# Patient Record
Sex: Male | Born: 1985 | Race: White | Hispanic: No | Marital: Married | Smoking: Former smoker
Health system: Southern US, Community
[De-identification: ages and names within clinical notes are randomized; demographics above are authoritative.]

## PROBLEM LIST (undated history)

## (undated) DIAGNOSIS — T7840XA Allergy, unspecified, initial encounter: Secondary | ICD-10-CM

## (undated) HISTORY — DX: Allergy, unspecified, initial encounter: T78.40XA

---

## 2009-10-21 ENCOUNTER — Emergency Department: Payer: Self-pay | Admitting: Emergency Medicine

## 2011-03-25 ENCOUNTER — Emergency Department: Payer: Self-pay

## 2013-08-02 ENCOUNTER — Emergency Department: Payer: Self-pay | Admitting: Emergency Medicine

## 2014-09-14 ENCOUNTER — Emergency Department: Payer: Self-pay

## 2014-09-14 ENCOUNTER — Emergency Department
Admission: EM | Admit: 2014-09-14 | Discharge: 2014-09-14 | Disposition: A | Discharge status: Home / Self Care | Payer: Self-pay | Attending: Emergency Medicine | Admitting: Emergency Medicine

## 2014-09-14 DIAGNOSIS — S4991XA Unspecified injury of right shoulder and upper arm, initial encounter: Secondary | ICD-10-CM | POA: Insufficient documentation

## 2014-09-14 DIAGNOSIS — L03115 Cellulitis of right lower limb: Secondary | ICD-10-CM | POA: Insufficient documentation

## 2014-09-14 DIAGNOSIS — W228XXA Striking against or struck by other objects, initial encounter: Secondary | ICD-10-CM | POA: Insufficient documentation

## 2014-09-14 DIAGNOSIS — Y9289 Other specified places as the place of occurrence of the external cause: Secondary | ICD-10-CM | POA: Insufficient documentation

## 2014-09-14 DIAGNOSIS — S80811A Abrasion, right lower leg, initial encounter: Secondary | ICD-10-CM | POA: Insufficient documentation

## 2014-09-14 DIAGNOSIS — S060X1A Concussion with loss of consciousness of 30 minutes or less, initial encounter: Secondary | ICD-10-CM | POA: Insufficient documentation

## 2014-09-14 DIAGNOSIS — Y9389 Activity, other specified: Secondary | ICD-10-CM | POA: Insufficient documentation

## 2014-09-14 DIAGNOSIS — Y998 Other external cause status: Secondary | ICD-10-CM | POA: Insufficient documentation

## 2014-09-14 DIAGNOSIS — S4992XA Unspecified injury of left shoulder and upper arm, initial encounter: Secondary | ICD-10-CM | POA: Insufficient documentation

## 2014-09-14 DIAGNOSIS — S199XXA Unspecified injury of neck, initial encounter: Secondary | ICD-10-CM | POA: Insufficient documentation

## 2014-09-14 MED ORDER — CEPHALEXIN 500 MG PO CAPS: 500.0000 mg | ORAL_CAPSULE | Freq: Four times a day (QID) | ORAL | Status: AC

## 2014-09-14 MED ORDER — SULFAMETHOXAZOLE-TRIMETHOPRIM 800-160 MG PO TABS: 1.0000 | ORAL_TABLET | Freq: Two times a day (BID) | ORAL | Status: DC

## 2014-09-14 MED ORDER — SULFAMETHOXAZOLE-TRIMETHOPRIM 800-160 MG PO TABS
1.0000 | ORAL_TABLET | Freq: Once | ORAL | Status: AC
  Administered 2014-09-14: 1 via ORAL
  Filled 2014-09-14: qty 1

## 2014-09-14 MED ORDER — CEPHALEXIN 500 MG PO CAPS
500.0000 mg | ORAL_CAPSULE | Freq: Once | ORAL | Status: AC
  Administered 2014-09-14: 500 mg via ORAL
  Filled 2014-09-14: qty 1
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
## 2014-09-14 NOTE — ED Notes (Signed)
Patient with no complaints at this time. Respirations even and unlabored. Skin warm/dry. Discharge instructions reviewed with patient at this time. Patient given opportunity to voice concerns/ask questions. IV removed per policy and band-aid applied to site. Patient discharged at this time and left Emergency Department with steady gait.  

## 2014-09-14 NOTE — Discharge Instructions (Signed)
Cellulitis Cellulitis is an infection of the skin and the tissue under the skin. The infected area is usually red and tender. This happens most often in the arms and lower legs. HOME CARE   Take your antibiotic medicine as told. Finish the medicine even if you start to feel better.  Keep the infected arm or leg raised (elevated).  Put a warm cloth on the area up to 4 times per day.  Only take medicines as told by your doctor.  Keep all doctor visits as told. GET HELP IF:  You see red streaks on the skin coming from the infected area.  Your red area gets bigger or turns a dark color.  Your bone or joint under the infected area is painful after the skin heals.  Your infection comes back in the same area or different area.  You have a puffy (swollen) bump in the infected area.  You have new symptoms.  You have a fever. GET HELP RIGHT AWAY IF:   You feel very sleepy.  You throw up (vomit) or have watery poop (diarrhea).  You feel sick and have muscle aches and pains. MAKE SURE YOU:   Understand these instructions.  Will watch your condition.  Will get help right away if you are not doing well or get worse. Document Released: 07/29/2007 Document Revised: 06/26/2013 Document Reviewed: 04/27/2011 [facility] Patient Information 2015 [place], [place]. This information is not intended to replace advice given to you by your health care provider. Make sure you discuss any questions you have with your health care provider.  Concussion A concussion is a brain injury. It is caused by:  A hit to the head.  A quick and sudden movement (jolt) of the head or neck. A concussion is usually not life threatening. Even so, it can cause serious problems. If you had a concussion before, you may have concussion-like problems after a hit to your head. HOME CARE General Instructions  Follow your doctor's directions carefully.  Take medicines only as told by your doctor.  Only take  medicines your doctor says are safe.  Do not drink alcohol until your doctor says it is okay. Alcohol and some drugs can slow down healing. They can also put you at risk for further injury.  If you are having trouble remembering things, write them down.  Try to do one thing at a time if you get distracted easily. For example, do not watch TV while making dinner.  Talk to your family members or close friends when making important decisions.  Follow up with your doctor as told.  Watch your symptoms. Tell others to do the same. Serious problems can sometimes happen after a concussion. Older adults are more likely to have these problems.  Tell your teachers, school nurse, school counselor, coach, Event organiser, or work Production designer, theatre/television/film about your concussion. Tell them about what you can or cannot do. They should watch to see if:  It gets even harder for you to pay attention or concentrate.  It gets even harder for you to remember things or learn new things.  You need more time than normal to finish things.  You become annoyed (irritable) more than before.  You are not able to deal with stress as well.  You have more problems than before.  Rest. Make sure you:  Get plenty of sleep at night.  Go to sleep early.  Go to bed at the same time every day. Try to wake up at the same time.  Rest during the day.  Take naps when you feel tired.  Limit activities where you have to think a lot or concentrate. These include:  Doing homework.  Doing work related to a job.  Watching TV.  Using the computer. Returning To Your Regular Activities Return to your normal activities slowly, not all at once. You must give your body and brain enough time to heal.   Do not play sports or do other athletic activities until your doctor says it is okay.  Ask your doctor when you can drive, ride a bicycle, or work other vehicles or machines. Never do these things if you feel dizzy.  Ask your doctor  about when you can return to work or school. Preventing Another Concussion It is very important to avoid another brain injury, especially before you have healed. In rare cases, another injury can lead to permanent brain damage, brain swelling, or death. The risk of this is greatest during the first 7-10 days after your injury. Avoid injuries by:   Wearing a seat belt when riding in a car.  Not drinking too much alcohol.  Avoiding activities that could lead to a second concussion (such as contact sports).  Wearing a helmet when doing activities like:  Biking.  Skiing.  Skateboarding.  Skating.  Making your home safer by:  Removing things from the floor or stairways that could make you trip.  Using grab bars in bathrooms and handrails by stairs.  Placing non-slip mats on floors and in bathtubs.  Improve lighting in dark areas. GET HELP IF:  It gets even harder for you to pay attention or concentrate.  It gets even harder for you to remember things or learn new things.  You need more time than normal to finish things.  You become annoyed (irritable) more than before.  You are not able to deal with stress as well.  You have more problems than before.  You have problems keeping your balance.  You are not able to react quickly when you should. Get help if you have any of these problems for more than 2 weeks:   Lasting (chronic) headaches.  Dizziness or trouble balancing.  Feeling sick to your stomach (nausea).  Seeing (vision) problems.  Being affected by noises or light more than normal.  Feeling sad, low, down in the dumps, blue, gloomy, or empty (depressed).  Mood changes (mood swings).  Feeling of fear or nervousness about what may happen (anxiety).  Feeling annoyed.  Memory problems.  Problems concentrating or paying attention.  Sleep problems.  Feeling tired all the time. GET HELP RIGHT AWAY IF:   You have bad headaches or your headaches get  worse.  You have weakness (even if it is in one hand, leg, or part of the face).  You have loss of feeling (numbness).  You feel off balance.  You keep throwing up (vomiting).  You feel tired.  One black center of your eye (pupil) is larger than the other.  You twitch or shake violently (convulse).  Your speech is not clear (slurred).  You are more confused, easily angered (agitated), or annoyed than before.  You have more trouble resting than before.  You are unable to recognize people or places.  You have neck pain.  It is difficult to wake you up.  You have unusual behavior changes.  You pass out (lose consciousness). MAKE SURE YOU:   Understand these instructions.  Will watch your condition.  Will get help right away if  you are not doing well or get worse. Document Released: 01/28/2009 Document Revised: 06/26/2013 Document Reviewed: 09/01/2012 [facility] Patient Information 2015 [place], [place]. This information is not intended to replace advice given to you by your health care provider. Make sure you discuss any questions you have with your health care provider.

## 2015-01-09 ENCOUNTER — Emergency Department: Payer: Self-pay

## 2015-01-09 ENCOUNTER — Emergency Department
Admission: EM | Admit: 2015-01-09 | Discharge: 2015-01-09 | Disposition: A | Discharge status: Home / Self Care | Payer: Self-pay | Attending: Emergency Medicine | Admitting: Emergency Medicine

## 2015-01-09 DIAGNOSIS — R519 Headache, unspecified: Secondary | ICD-10-CM

## 2015-01-09 DIAGNOSIS — K0889 Other specified disorders of teeth and supporting structures: Secondary | ICD-10-CM | POA: Insufficient documentation

## 2015-01-09 DIAGNOSIS — F17210 Nicotine dependence, cigarettes, uncomplicated: Secondary | ICD-10-CM | POA: Insufficient documentation

## 2015-01-09 DIAGNOSIS — Z79899 Other long term (current) drug therapy: Secondary | ICD-10-CM | POA: Insufficient documentation

## 2015-01-09 DIAGNOSIS — R51 Headache: Secondary | ICD-10-CM | POA: Insufficient documentation

## 2015-01-09 LAB — CBC
HCT: 41.7 % (ref 40.0–52.0)
Hemoglobin: 14.3 g/dL (ref 13.0–18.0)
MCH: 29.3 pg (ref 26.0–34.0)
MCHC: 34.3 g/dL (ref 32.0–36.0)
MCV: 85.2 fL (ref 80.0–100.0)
Platelets: 155 K/uL (ref 150–440)
RBC: 4.89 MIL/uL (ref 4.40–5.90)
RDW: 13.1 % (ref 11.5–14.5)
WBC: 8.6 K/uL (ref 3.8–10.6)

## 2015-01-09 LAB — COMPREHENSIVE METABOLIC PANEL WITH GFR
ALT: 47 U/L (ref 17–63)
AST: 29 U/L (ref 15–41)
Albumin: 4.4 g/dL (ref 3.5–5.0)
Alkaline Phosphatase: 70 U/L (ref 38–126)
Anion gap: 6 (ref 5–15)
BUN: 12 mg/dL (ref 6–20)
CO2: 26 mmol/L (ref 22–32)
Calcium: 9.2 mg/dL (ref 8.9–10.3)
Chloride: 106 mmol/L (ref 101–111)
Creatinine, Ser: 0.76 mg/dL (ref 0.61–1.24)
GFR calc Af Amer: >60 mL/min
GFR calc non Af Amer: >60 mL/min
Glucose, Bld: 106 mg/dL — ABNORMAL HIGH (ref 65–99)
Potassium: 4.4 mmol/L (ref 3.5–5.1)
Sodium: 138 mmol/L (ref 135–145)
Total Bilirubin: 1 mg/dL (ref 0.3–1.2)
Total Protein: 7.4 g/dL (ref 6.5–8.1)

## 2015-01-09 MED ORDER — HYDROCODONE-ACETAMINOPHEN 5-325 MG PO TABS: 1.0000 | ORAL_TABLET | ORAL | Status: DC | PRN

## 2015-01-09 MED ORDER — KETOROLAC TROMETHAMINE 30 MG/ML IJ SOLN
30.0000 mg | Freq: Once | INTRAMUSCULAR | Status: AC
  Administered 2015-01-09: 30 mg via INTRAVENOUS
  Filled 2015-01-09: qty 1

## 2015-01-09 MED ORDER — PENICILLIN V POTASSIUM 500 MG PO TABS: 500.0000 mg | ORAL_TABLET | Freq: Four times a day (QID) | ORAL | Status: DC

## 2015-01-09 MED ORDER — DIPHENHYDRAMINE HCL 50 MG/ML IJ SOLN
50.0000 mg | Freq: Once | INTRAMUSCULAR | Status: AC
  Administered 2015-01-09: 50 mg via INTRAVENOUS
  Filled 2015-01-09: qty 1

## 2015-01-09 MED ORDER — METOCLOPRAMIDE HCL 5 MG/ML IJ SOLN
10.0000 mg | Freq: Once | INTRAMUSCULAR | Status: AC
  Administered 2015-01-09: 10 mg via INTRAVENOUS
  Filled 2015-01-09: qty 2

## 2015-01-09 NOTE — Discharge Instructions (Signed)
OPTIONS FOR DENTAL FOLLOW UP CARE ° °[place] Department of Health and Human Services - Local Safety Net Dental Clinics °http://www.ncdhhs.gov/dph/oralhealth/services/safetynetclinics.htm °  °[facility] (336-562-3123) ° °[facility] [place] ([phone]) ° °[facility] ([phone] ext [phone]) ° °[facility] ([phone]) ° °[facility] ([phone]) °This clinic caters to the indigent population and is on a lottery system. °Location: °[facility], [address], [place] °Clinic Hours: °Wednesdays from 6pm - 9pm, patients seen by a lottery system. °For dates, call or go to [email or web address].[facility].edu/[facility]/patients/Dental-[facility] °Services: °Cleanings, fillings and simple extractions. °Payment Options: °DENTAL WORK IS FREE OF CHARGE. Bring proof of income or support. °Best way to get seen: °Arrive at 5:15 pm - this is a lottery, NOT first come/first serve, so arriving earlier will not increase your chances of being seen. °  °  °[facility] °[phone] °Select option 1 for emergencies °  °Location: °[facility], [address], [place] °Clinic Hours: °No walk-ins accepted - call the day before to schedule an appointment. °Check in times are 9:30 am and 1:30 pm. °Services: °Simple extractions, temporary fillings, pulpectomy/pulp debridement, uncomplicated abscess drainage. °Payment Options: °PAYMENT IS DUE AT THE TIME OF SERVICE.  Fee is usually $100-200, additional surgical procedures (e.g. abscess drainage) may be extra. °Cash, checks, Visa/MasterCard accepted.  Can file Medicaid if patient is covered for dental - patient should call case worker to check. °No discount for [facility] patients. °Best way to get seen: °MUST call the day before and get onto the schedule. Can usually be seen the next 1-2 days. No walk-ins accepted. °  °  °[facility] °[phone] °   °Location: °[facility], [address], [place] °Clinic Hours: °M, W, Th, F 8am or 1:30pm, Tues 9a or 1:30 - first come/first served. °Services: °Simple extractions, temporary fillings, uncomplicated abscess drainage.  You do not need to be an [place] resident. °Payment Options: °PAYMENT IS DUE AT THE TIME OF SERVICE. °Dental insurance, otherwise sliding scale - bring proof of income or support. °Depending on income and treatment needed, cost is usually $50-200. °Best way to get seen: °Arrive early as it is first come/first served. °  °  °[facility] °[phone] °  °Location: °[address] °Clinic Hours: °Mon-Thu 8a-5p °Services: °Most basic dental services including extractions and fillings. °Payment Options: °PAYMENT IS DUE AT THE TIME OF SERVICE. °Sliding scale, up to 50% off - bring proof if income or support. °Medicaid with dental option accepted. °Best way to get seen: °Call to schedule an appointment, can usually be seen within 2 weeks OR they will try to see walk-ins - show up at 8a or 2p (you may have to wait). °  °  °[facility] °[phone] °[place] RESIDENTS ONLY °  °Location: °[facility], [address], [place],  [address] °Clinic Hours: By appointment only. °Monday - Thursday 8am-5pm, Friday 8am-12pm °Services: Cleanings, fillings, extractions. °Payment Options: °PAYMENT IS DUE AT THE TIME OF SERVICE. °Cash, Visa or MasterCard. Sliding scale - $30 minimum per service. °Best way to get seen: °Come in to office, complete packet and make an appointment - need proof of income °or support monies for each household member and proof of [place] residence. °Usually takes about a month to get in. °  °  °[facility] °[phone] °  °Location: °[address]   [place] [facility] Hours: Walk-in Urgent Care Dental Services are offered Monday-Friday  mornings only. The numbers of emergencies accepted daily is limited to the number of providers available. Maximum 15 - Mondays, Wednesdays & Thursdays Maximum 10 - Tuesdays & Fridays Services: You do not need to be a [facility] resident to be seen for a dental emergency. Emergencies are defined as pain, swelling, abnormal bleeding, or dental trauma. Walkins will receive x-rays if needed. NOTE: Dental cleaning is not an emergency. Payment Options: PAYMENT IS DUE AT THE TIME OF SERVICE. Minimum co-pay is $40.00 for uninsured patients. Minimum co-pay is $3.00 for Medicaid with dental coverage. Dental Insurance is accepted and must be presented at time of visit. Medicare does not cover dental. Forms of payment: Cash, credit card, checks. Best way to get seen: If not previously registered with the clinic, walk-in dental registration begins at 7:15 am and is on a first come/first serve basis. If previously registered with the clinic, call to make an appointment.     [facility] [phone] [place] RESIDENTS ONLY   Location: [address], [place], [place] Clinic Hours: Mon-Thu 10a-2p Services: Extractions only! Payment Options: FREE (donations accepted) - bring proof of income or support Best way to get seen: Call and schedule an appointment OR come at 8am on the 1st Monday of every month (except for holidays) when it is first come/first served.     [facility] [phone]   Location: [address] [place], [place] Clinic Hours: Friday mornings Services, Payment Options, Best way to get seen: Call for info    General Headache Without Cause A headache is pain or discomfort felt around the head or neck area. There are many causes and types of headaches. In some cases, the cause may not be found.  HOME CARE  Managing Pain  Take over-the-counter and prescription medicines only as told by your doctor.  Lie down in a dark, quiet room when you have a  headache.  If directed, apply ice to the head and neck area:  Put ice in a plastic bag.  Place a towel between your skin and the bag.  Leave the ice on for 20 minutes, 2-3 times per day.  Use a heating pad or hot shower to apply heat to the head and neck area as told by your doctor.  Keep lights dim if bright lights bother you or make your headaches worse. Eating and Drinking  Eat meals on a regular schedule.  Lessen how much alcohol you drink.  Lessen how much caffeine you drink, or stop drinking caffeine. General Instructions  Keep all follow-up visits as told by your doctor. This is important.  Keep a journal to find out if certain things bring on headaches. For example, write down:  What you eat and drink.  How much sleep you get.  Any change to your diet or medicines.  Relax by getting a massage or doing other relaxing activities.  Lessen stress.  Sit up straight. Do not tighten (tense) your muscles.  Do not use tobacco products. This includes cigarettes, chewing tobacco, or e-cigarettes. If you need help quitting, ask your doctor.  Exercise regularly as told by your doctor.  Get enough sleep. This often means 7-9 hours of sleep. GET HELP IF:  Your symptoms are not helped by medicine.  You have a headache that feels different than the other headaches.  You feel sick to your stomach (nauseous) or you throw up (vomit).  You have  a fever. GET HELP RIGHT AWAY IF:   Your headache becomes really bad.  You keep throwing up.  You have a stiff neck.  You have trouble seeing.  You have trouble speaking.  You have pain in the eye or ear.  Your muscles are weak or you lose muscle control.  You lose your balance or have trouble walking.  You feel like you will pass out (faint) or you pass out.  You have confusion.   This information is not intended to replace advice given to you by your health care provider. Make sure you discuss any questions you  have with your health care provider.   Document Released: 11/19/2007 Document Revised: 10/31/2014 Document Reviewed: 06/04/2014 Elsevier Interactive Patient Education [employer]2016 [employer]
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
## 2015-01-09 NOTE — ED Provider Notes (Signed)
[facility] Emergency Department Provider Note  Time seen: 10:53 AM  I have reviewed the triage vital signs and the nursing notes.   HISTORY  Chief Complaint Headache    HPI Jesse Kane is a 29 y.o. male with no past medical history who presents the emergency department with a headache. According to the patient he has had an intermittent headache for the past 3 weeks. He describes it as coming and going, usually resolves with ibuprofen. He states for the past 3-4 days it has been constant. States it hurts to look Bright light, hurts to hear loud noises. Denies any fever, nausea, vomiting, neck pain or stiffness. Denies any history of migraines. Does state he has been having some tooth pain recently as well due to cavities, which has worsened over the past several weeks.Describes his headache as mostly left-sided, aching pain starts in the forehead and comes down the left side of his face.     History reviewed. No pertinent past medical history.  There are no active problems to display for this patient.   History reviewed. No pertinent past surgical history.  Current Outpatient Rx  Name  Route  Sig  Dispense  Refill  . sulfamethoxazole-trimethoprim (BACTRIM DS,SEPTRA DS) 800-160 MG per tablet   Oral   Take 1 tablet by mouth 2 (two) times daily.   20 tablet   0     Allergies Review of patient's allergies indicates no known allergies.  No family history on file.  Social History Social History  Substance Use Topics  . Smoking status: Current Every Day Smoker    Types: Cigarettes  . Smokeless tobacco: Never Used  . Alcohol Use: Yes     Comment: rarely    Review of Systems Constitutional: Negative for fever. Cardiovascular: Negative for chest pain. Respiratory: Negative for shortness of breath. Gastrointestinal: Negative for abdominal pain Musculoskeletal: Negative for back pain. Negative for neck pain. Skin: Negative for rash. Neurological:  Positive for headache. Denies any weakness or numbness, slurred speech or confusion. 10-point ROS otherwise negative.  ____________________________________________   PHYSICAL EXAM:  VITAL SIGNS: ED Triage Vitals  Enc Vitals Group     BP 01/09/15 0913 144/67 mmHg     Pulse Rate 01/09/15 0913 73     Resp 01/09/15 0913 16     Temp 01/09/15 0913 97.5 F (36.4 C)     Temp Source 01/09/15 0913 Oral     SpO2 01/09/15 0913 97 %     Weight 01/09/15 0913 200 lb (90.719 kg)     Height 01/09/15 0913  (1.88 m)     Head Cir --      Peak Flow --      Pain Score 01/09/15 0914 8     Pain Loc --      Pain Edu? --      Excl. in GC? --    Constitutional: Alert and oriented. Well appearing and in no distress. Eyes: Normal exam ENT   Head: Normocephalic and atraumatic. Mild photophobia. PERRL, 2 mm. EOMI.   Mouth/Throat: Mucous membranes are moist. Many dental caries, with tooth decay. Left upper molar is tender, no signs of abscess. Cardiovascular: Normal rate, regular rhythm. No murmur Respiratory: Normal respiratory effort without tachypnea nor retractions. Breath sounds are clear  Gastrointestinal: Soft and nontender. No distention.   Musculoskeletal: Nontender with normal range of motion in all extremities.  Neurologic:  Normal speech and language. No gross focal neurologic deficits Skin:  Skin is  warm, dry and intact.  Psychiatric: Mood and affect are normal. Speech and behavior are normal.   ____________________________________________     RADIOLOGY  CT shows no acute abnormality  ____________________________________________    INITIAL IMPRESSION / ASSESSMENT AND PLAN / ED COURSE  Pertinent labs & imaging results that were available during my care of the patient were reviewed by me and considered in my medical decision making (see chart for details).  Patient presents with 3 weeks of intermittent headache. He also states for the past several weeks he has had  increased tooth pain especially on the left. Patient's headache is also on the left. Denies any fever, neck pain or stiffness. No meningismus on exam. Minimal photophobia on exam. No signs of tooth abscess, but the patient does have poor dental care overall with several decayed teeth on the left side. Given his left-sided headache, with left tooth pain for similar time durations I believe the 2 are linked. We'll check a head CT as the patient does not have a history of severe headaches. We'll check basic labs and dose IV medications. The patient's headache symptoms improved we will likely discharge on penicillin for his teeth.  CT negative. We'll discharge home with penicillin and a short course of Norco. The patient is to follow up with a dentist which is likely the cause of his headache. I discussed my normal headache return precautions with the patient, they are agreeable.  ____________________________________________   FINAL CLINICAL IMPRESSION(S) / ED DIAGNOSES  Headache Tooth pain   Minna AntisKevin Riggins Cisek, MD 01/09/15 1318

## 2015-01-09 NOTE — ED Notes (Signed)
Pt c/o HA for the past 24hrs with nausea and photophobia. Denies vomiting or hx of migraines.. States he has taken IBU with no relief.

## 2015-12-31 ENCOUNTER — Encounter: Payer: Self-pay | Admitting: Emergency Medicine

## 2015-12-31 ENCOUNTER — Emergency Department: Payer: Self-pay

## 2015-12-31 ENCOUNTER — Emergency Department
Admission: EM | Admit: 2015-12-31 | Discharge: 2015-12-31 | Disposition: A | Discharge status: Home / Self Care | Payer: Self-pay | Attending: Student in an Organized Health Care Education/Training Program | Admitting: Student in an Organized Health Care Education/Training Program

## 2015-12-31 DIAGNOSIS — F17210 Nicotine dependence, cigarettes, uncomplicated: Secondary | ICD-10-CM | POA: Insufficient documentation

## 2015-12-31 DIAGNOSIS — J069 Acute upper respiratory infection, unspecified: Secondary | ICD-10-CM | POA: Insufficient documentation

## 2015-12-31 LAB — URINALYSIS COMPLETE WITH MICROSCOPIC (ARMC ONLY)
BACTERIA UA: NONE SEEN
BILIRUBIN URINE: NEGATIVE
Glucose, UA: NEGATIVE mg/dL
KETONES UR: NEGATIVE mg/dL
Leukocytes, UA: NEGATIVE
Nitrite: NEGATIVE
PH: 7 (ref 5.0–8.0)
Protein, ur: NEGATIVE mg/dL
Specific Gravity, Urine: 1.001 — ABNORMAL LOW (ref 1.005–1.030)
Squamous Epithelial / LPF: NONE SEEN

## 2015-12-31 LAB — COMPREHENSIVE METABOLIC PANEL
ALK PHOS: 69 U/L (ref 38–126)
ALT: 24 U/L (ref 17–63)
AST: 25 U/L (ref 15–41)
Albumin: 4.7 g/dL (ref 3.5–5.0)
Anion gap: 9 (ref 5–15)
BUN: 7 mg/dL (ref 6–20)
CO2: 26 mmol/L (ref 22–32)
Calcium: 9.2 mg/dL (ref 8.9–10.3)
Chloride: 96 mmol/L — ABNORMAL LOW (ref 101–111)
Creatinine, Ser: 0.66 mg/dL (ref 0.61–1.24)
GFR calc Af Amer: >60 mL/min (ref >60)
GFR calc non Af Amer: >60 mL/min (ref >60)
Glucose, Bld: 99 mg/dL (ref 65–99)
Potassium: 3.9 mmol/L (ref 3.5–5.1)
SODIUM: 131 mmol/L — AB (ref 135–145)
TOTAL PROTEIN: 7.6 g/dL (ref 6.5–8.1)
Total Bilirubin: 1.3 mg/dL — ABNORMAL HIGH (ref 0.3–1.2)

## 2015-12-31 LAB — INFLUENZA PANEL BY PCR (TYPE A & B)
INFLBPCR: NEGATIVE
Influenza A By PCR: NEGATIVE

## 2015-12-31 LAB — CBC
HCT: 42.4 % (ref 40.0–52.0)
Hemoglobin: 14.9 g/dL (ref 13.0–18.0)
MCH: 29.4 pg (ref 26.0–34.0)
MCHC: 35.1 g/dL (ref 32.0–36.0)
MCV: 83.8 fL (ref 80.0–100.0)
Platelets: 176 10*3/uL (ref 150–440)
RBC: 5.07 MIL/uL (ref 4.40–5.90)
RDW: 12.9 % (ref 11.5–14.5)
WBC: 13.2 10*3/uL — AB (ref 3.8–10.6)

## 2015-12-31 LAB — LIPASE, BLOOD: Lipase: 18 U/L (ref 11–51)

## 2015-12-31 MED ORDER — PROCHLORPERAZINE EDISYLATE 5 MG/ML IJ SOLN
10.0000 mg | Freq: Once | INTRAMUSCULAR | Status: AC
  Administered 2015-12-31: 10 mg via INTRAVENOUS
  Filled 2015-12-31 (×2): qty 2

## 2015-12-31 MED ORDER — ACETAMINOPHEN 500 MG PO TABS
1000.0000 mg | ORAL_TABLET | Freq: Once | ORAL | Status: AC
  Administered 2015-12-31: 1000 mg via ORAL
  Filled 2015-12-31: qty 2

## 2015-12-31 MED ORDER — AZITHROMYCIN 250 MG PO TABS: ORAL_TABLET | ORAL | 0 refills | Status: AC

## 2015-12-31 MED ORDER — PROMETHAZINE HCL 12.5 MG PO TABS: 12.5000 mg | ORAL_TABLET | Freq: Four times a day (QID) | ORAL | 0 refills | Status: DC | PRN

## 2015-12-31 MED ORDER — SODIUM CHLORIDE 0.9 % IV BOLUS (SEPSIS)
1000.0000 mL | Freq: Once | INTRAVENOUS | Status: AC
  Administered 2015-12-31: 1000 mL via INTRAVENOUS

## 2015-12-31 MED ORDER — DIPHENHYDRAMINE HCL 50 MG/ML IJ SOLN
25.0000 mg | Freq: Once | INTRAMUSCULAR | Status: AC
  Administered 2015-12-31: 25 mg via INTRAVENOUS
  Filled 2015-12-31: qty 1

## 2015-12-31 NOTE — ED Provider Notes (Signed)
[facility] Emergency Department Provider Note    First MD Initiated Contact with Patient 12/31/15 1502     (approximate)  I have reviewed the triage vital signs and the nursing notes.   HISTORY  Chief Complaint Headache; Cough; and Emesis    HPI Jesse Kane is a 30 y.o. male  recently healthy male presents with 3 days of gradually worsening sore throat, nausea and headache. Patient states that his symptoms initially started with generalized malaise as well as cold chills on Saturday. States that she's also been having scantly productive cough. No measured fevers. He's been taking ibuprofen for the headache which initially helped but the headache has gradually worsened over the past 3 days. States the headache is located located and his forehead. No neck stiffness or neck pain. No difficulty with his vision. Denies any rashes.  He's been trying to drink water to stay hydrated but his had roughly 8 episodes of emesis today. Denies any blood or bilious vomiting.  History reviewed. No pertinent past medical history.  There are no active problems to display for this patient.   History reviewed. No pertinent surgical history.  Prior to Admission medications   Medication Sig Start Date End Date Taking? Authorizing Provider  azithromycin (ZITHROMAX Z-PAK) 250 MG tablet Take 2 tablets (500 mg) on  Day 1,  followed by 1 tablet (250 mg) once daily on Days 2 through 5. 12/31/15 01/05/16  Willy Eddy, MD  HYDROcodone-acetaminophen (NORCO/VICODIN) 5-325 MG tablet Take 1 tablet by mouth every 4 (four) hours as needed for moderate pain. 01/09/15   Minna Antis, MD  penicillin v potassium (VEETID) 500 MG tablet Take 1 tablet (500 mg total) by mouth 4 (four) times daily. 01/09/15   Minna Antis, MD  promethazine (PHENERGAN) 12.5 MG tablet Take 1 tablet (12.5 mg total) by mouth every 6 (six) hours as needed for nausea or vomiting. 12/31/15   Willy Eddy, MD    sulfamethoxazole-trimethoprim (BACTRIM DS,SEPTRA DS) 800-160 MG per tablet Take 1 tablet by mouth 2 (two) times daily. 09/14/14   Myrna Blazer, MD    Allergies Patient has no known allergies.  No family history on file.  Social History Social History  Substance Use Topics  . Smoking status: Current Every Day Smoker    Types: Cigarettes  . Smokeless tobacco: Never Used  . Alcohol use Yes     Comment: rarely    Review of Systems Patient denies headaches, rhinorrhea, blurry vision, numbness, shortness of breath, chest pain, edema, cough, abdominal pain, nausea, vomiting, diarrhea, dysuria, fevers, rashes or hallucinations unless otherwise stated above in HPI. ____________________________________________   PHYSICAL EXAM:  VITAL SIGNS: Vitals:   12/31/15 1347  BP: (!) 142/93  Pulse: 81  Resp: 16  Temp: 97.9 F (36.6 C)    Constitutional: Alert and oriented. Ill appearing but in NAD Eyes: Conjunctivae are normal. PERRL. EOMI. Head: Atraumatic. Nose: No congestion/rhinnorhea. Mouth/Throat: Mucous membranes are moist.  Oropharynx non-erythematous.  No exudates, tonsills are swollen but equal Neck: No stridor. Painless ROM. No cervical spine tenderness to palpation Hematological/Lymphatic/Immunilogical: No cervical lymphadenopathy. Cardiovascular: Normal rate, regular rhythm. Grossly normal heart sounds.  Good peripheral circulation. Respiratory: Normal respiratory effort.  No retractions. Lungs CTAB. Gastrointestinal: Soft and nontender. No distention. No abdominal bruits. No CVA tenderness. Musculoskeletal: No lower extremity tenderness nor edema.  No joint effusions. Neurologic:  Normal speech and language. No gross focal neurologic deficits are appreciated. No gait instability. Skin:  Skin is warm, dry  and intact. No rash noted. Psychiatric: Mood and affect are normal. Speech and behavior are normal.  ____________________________________________   LABS (all  labs ordered are listed, but only abnormal results are displayed)  Results for orders placed or performed during the hospital encounter of 12/31/15 (from the past 24 hour(s))  Lipase, blood     Status: None   Collection Time: 12/31/15  1:52 PM  Result Value Ref Range   Lipase 18 11 - 51 U/L  Comprehensive metabolic panel     Status: Abnormal   Collection Time: 12/31/15  1:52 PM  Result Value Ref Range   Sodium 131 (L) 135 - 145 mmol/L   Potassium 3.9 3.5 - 5.1 mmol/L   Chloride 96 (L) 101 - 111 mmol/L   CO2 26 22 - 32 mmol/L   Glucose, Bld 99 65 - 99 mg/dL   BUN 7 6 - 20 mg/dL   Creatinine, Ser 9.140.66 0.61 - 1.24 mg/dL   Calcium 9.2 8.9 - 78.210.3 mg/dL   Total Protein 7.6 6.5 - 8.1 g/dL   Albumin 4.7 3.5 - 5.0 g/dL   AST 25 15 - 41 U/L   ALT 24 17 - 63 U/L   Alkaline Phosphatase 69 38 - 126 U/L   Total Bilirubin 1.3 (H) 0.3 - 1.2 mg/dL   GFR calc non Af Amer >60 >60 mL/min   GFR calc Af Amer >60 >60 mL/min   Anion gap 9 5 - 15  CBC     Status: Abnormal   Collection Time: 12/31/15  1:52 PM  Result Value Ref Range   WBC 13.2 (H) 3.8 - 10.6 K/uL   RBC 5.07 4.40 - 5.90 MIL/uL   Hemoglobin 14.9 13.0 - 18.0 g/dL   HCT 95.642.4 21.340.0 - 08.652.0 %   MCV 83.8 80.0 - 100.0 fL   MCH 29.4 26.0 - 34.0 pg   MCHC 35.1 32.0 - 36.0 g/dL   RDW 57.812.9 46.911.5 - 62.914.5 %   Platelets 176 150 - 440 K/uL  Urinalysis complete, with microscopic     Status: Abnormal   Collection Time: 12/31/15  1:52 PM  Result Value Ref Range   Color, Urine COLORLESS (A) YELLOW   APPearance CLEAR (A) CLEAR   Glucose, UA NEGATIVE NEGATIVE mg/dL   Bilirubin Urine NEGATIVE NEGATIVE   Ketones, ur NEGATIVE NEGATIVE mg/dL   Specific Gravity, Urine 1.001 (L) 1.005 - 1.030   Hgb urine dipstick 2+ (A) NEGATIVE   pH 7.0 5.0 - 8.0   Protein, ur NEGATIVE NEGATIVE mg/dL   Nitrite NEGATIVE NEGATIVE   Leukocytes, UA NEGATIVE NEGATIVE   RBC / HPF 0-5 0 - 5 RBC/hpf   WBC, UA 0-5 0 - 5 WBC/hpf   Bacteria, UA NONE SEEN NONE SEEN   Squamous  Epithelial / LPF NONE SEEN NONE SEEN  Influenza panel by PCR (type A & B, H1N1)     Status: None   Collection Time: 12/31/15  4:46 PM  Result Value Ref Range   Influenza A By PCR NEGATIVE NEGATIVE   Influenza B By PCR NEGATIVE NEGATIVE   ____________________________________________ ____________________________________________  RADIOLOGY  I personally reviewed all radiographic images ordered to evaluate for the above acute complaints and reviewed radiology reports and findings.  These findings were personally discussed with the patient.  Please see medical record for radiology report.  ____________________________________________   PROCEDURES  Procedure(s) performed: none    Critical Care performed: no ____________________________________________   INITIAL IMPRESSION / ASSESSMENT AND PLAN / ED COURSE  Pertinent labs & imaging results that were available during my care of the patient were reviewed by me and considered in my medical decision making (see chart for details).  DDX: flu, strep throat, pna, sepsis, uri, sinusitits, abscess, ludwigs  Jesse Kane is a 30 y.o. who presents to the ED with complaint of several days of flulike symptoms.  Patient is AFVSS in ED. Exam as above. Given current presentation have considered the above differential.  Chest x-ray ordered to evaluate for any pneumonia. No evidence of pneumonia. Patient is having several episodes of nonbilious nonbloody vomiting. We'll give IV Phenergan. Patient describes steadily worsening headache or the past several days. Clinically not consistent with meningitis. Patient does not have any rash no neck stiffness is afebrile and does not have any neuro deficits. Possible sinusitis or tension headache. Will order laboratory evaluation to evaluate for any significant like to let abnormalities given his vomiting. We'll give IV fluids for his dehydration. Will treat headache.   Clinical Course     ----------------------------------------- 5:57 PM on 12/31/2015 -----------------------------------------  Patient reassessed and feels much improved after migraine cocktail. Flu test negative. Based on his duration of symptoms nausea and vomiting will discharge home with antiemetics as well as Z-Pak for probable sinusitis. Discussed nasal spray usage for symptomatically controlled. Discussed signs and symptoms for which the patient should return to the Er. Patient was able to ambulate with a steady gait. Has no other symptoms or complaints at this time.  Have discussed with the patient and available family all diagnostics and treatments performed thus far and all questions were answered to the best of my ability. The patient demonstrates understanding and agreement with plan.   ____________________________________________   FINAL CLINICAL IMPRESSION(S) / ED DIAGNOSES  Final diagnoses:  Upper respiratory tract infection, unspecified type      NEW MEDICATIONS STARTED DURING THIS VISIT:  New Prescriptions   AZITHROMYCIN (ZITHROMAX Z-PAK) 250 MG TABLET    Take 2 tablets (500 mg) on  Day 1,  followed by 1 tablet (250 mg) once daily on Days 2 through 5.   PROMETHAZINE (PHENERGAN) 12.5 MG TABLET    Take 1 tablet (12.5 mg total) by mouth every 6 (six) hours as needed for nausea or vomiting.     Note:  This document was prepared using Dragon voice recognition software and may include unintentional dictation errors.    Willy EddyPatrick Shamiracle Gorden, MD 12/31/15 1758

## 2015-12-31 NOTE — ED Notes (Signed)
Pt presents with headache since the weekend that he has treated with ibuprofen, with no relief. Pt states he began to have vomiting last night. Pt has vomited 8 x since it started, most recently in the waiting room. Pt also c/o pain and swelling in left jaw. States he has "a real bad tooth." Pt denies diarrhea, but states his stools have been "soft."

## 2015-12-31 NOTE — ED Notes (Signed)
Pt discharged home after verbalizing understanding of discharge instructions; nad noted. 

## 2015-12-31 NOTE — ED Notes (Signed)
AAOx3.  Skin warm and dry. Patient drinking water in triage. No N/V noted.

## 2015-12-31 NOTE — ED Triage Notes (Addendum)
C/O headache intermittently since Saturday.  Last night also c/o cold chills, vomiting and productive cough.  [facility] and referred to ED for evaluation due to year old history of concussion.

## 2016-05-31 ENCOUNTER — Emergency Department
Admission: EM | Admit: 2016-05-31 | Discharge: 2016-06-01 | Disposition: A | Discharge status: Other Acute Inpatient Hospital | Payer: Self-pay | Attending: Emergency Medicine | Admitting: Emergency Medicine

## 2016-05-31 DIAGNOSIS — Z79899 Other long term (current) drug therapy: Secondary | ICD-10-CM | POA: Insufficient documentation

## 2016-05-31 DIAGNOSIS — F1910 Other psychoactive substance abuse, uncomplicated: Secondary | ICD-10-CM | POA: Insufficient documentation

## 2016-05-31 DIAGNOSIS — F17210 Nicotine dependence, cigarettes, uncomplicated: Secondary | ICD-10-CM | POA: Insufficient documentation

## 2016-05-31 DIAGNOSIS — F32A Depression, unspecified: Secondary | ICD-10-CM

## 2016-05-31 DIAGNOSIS — F329 Major depressive disorder, single episode, unspecified: Secondary | ICD-10-CM | POA: Insufficient documentation

## 2016-05-31 DIAGNOSIS — R45851 Suicidal ideations: Secondary | ICD-10-CM

## 2016-05-31 LAB — CBC WITH DIFFERENTIAL/PLATELET
BASOS ABS: 0.1 10*3/uL (ref 0–0.1)
Basophils Relative: 1 %
EOS ABS: 0.6 10*3/uL (ref 0–0.7)
EOS PCT: 6 %
HCT: 43.8 % (ref 40.0–52.0)
Hemoglobin: 15.1 g/dL (ref 13.0–18.0)
LYMPHS PCT: 31 %
Lymphs Abs: 3.3 10*3/uL (ref 1.0–3.6)
MCH: 29.1 pg (ref 26.0–34.0)
MCHC: 34.5 g/dL (ref 32.0–36.0)
MCV: 84.4 fL (ref 80.0–100.0)
MONO ABS: 1 10*3/uL (ref 0.2–1.0)
Monocytes Relative: 10 %
Neutro Abs: 5.5 10*3/uL (ref 1.4–6.5)
Neutrophils Relative %: 52 %
PLATELETS: 194 10*3/uL (ref 150–440)
RBC: 5.18 MIL/uL (ref 4.40–5.90)
RDW: 13.1 % (ref 11.5–14.5)
WBC: 10.5 10*3/uL (ref 3.8–10.6)

## 2016-05-31 LAB — COMPREHENSIVE METABOLIC PANEL
ALK PHOS: 89 U/L (ref 38–126)
ALT: 22 U/L (ref 17–63)
AST: 23 U/L (ref 15–41)
Albumin: 4.5 g/dL (ref 3.5–5.0)
Anion gap: 7 (ref 5–15)
BILIRUBIN TOTAL: 0.9 mg/dL (ref 0.3–1.2)
BUN: 12 mg/dL (ref 6–20)
CALCIUM: 9.2 mg/dL (ref 8.9–10.3)
CHLORIDE: 102 mmol/L (ref 101–111)
CO2: 28 mmol/L (ref 22–32)
Creatinine, Ser: 0.99 mg/dL (ref 0.61–1.24)
Glucose, Bld: 79 mg/dL (ref 65–99)
Potassium: 3.1 mmol/L — ABNORMAL LOW (ref 3.5–5.1)
Sodium: 137 mmol/L (ref 135–145)
TOTAL PROTEIN: 7.7 g/dL (ref 6.5–8.1)

## 2016-05-31 LAB — URINE DRUG SCREEN, QUALITATIVE (ARMC ONLY)
Amphetamines, Ur Screen: NOT DETECTED
BARBITURATES, UR SCREEN: NOT DETECTED
Benzodiazepine, Ur Scrn: NOT DETECTED
CANNABINOID 50 NG, UR ~~LOC~~: POSITIVE — AB
COCAINE METABOLITE, UR ~~LOC~~: POSITIVE — AB
MDMA (Ecstasy)Ur Screen: NOT DETECTED
Methadone Scn, Ur: NOT DETECTED
Opiate, Ur Screen: NOT DETECTED
Phencyclidine (PCP) Ur S: NOT DETECTED
TRICYCLIC, UR SCREEN: NOT DETECTED

## 2016-05-31 LAB — SALICYLATE LEVEL

## 2016-05-31 LAB — ACETAMINOPHEN LEVEL: Acetaminophen (Tylenol), Serum: <10 ug/mL — ABNORMAL LOW (ref 10–30)

## 2016-05-31 LAB — ETHANOL

## 2016-05-31 NOTE — ED Notes (Signed)
Pt resting in bed, resp even and unlabored, denies any needs 

## 2016-05-31 NOTE — ED Notes (Signed)
Pt resting in bed, eyes closed, resp even and unlabored

## 2016-05-31 NOTE — ED Notes (Signed)
Pt denied SI, HI, and AVH. He was calm and cooperative upon arrival to unit. He's remained in bed, with eyes closed and regular/even respirations, since arrival and voiced no needs when offered the opportunity. Fluids/lunch were offered. Pt has been monitored for needs/safety. Will continue to do so.

## 2016-05-31 NOTE — ED Notes (Signed)
This tech attempted a lab draw and was unsuccessful. RN notified

## 2016-05-31 NOTE — BH Assessment (Signed)
Patient has been accepted to [facility].  Patient assigned to [facility] [address], [place], [place] [address] Accepting physician is Dr. Monico Hoar.  Call report to [phone].  Representative was [employer].  ER Staff is aware of it Maia Breslow, ER Sect.; Dr. Merrily Brittle, ER MD & Clydie Braun, Patient's Nurse).   Patient bed is available and can come anytime. Writer informed the facility the patient will not transport until tomorrow, due to [employer].

## 2016-05-31 NOTE — ED Triage Notes (Signed)
Here tonight with [facility] IVC papers in hand that state patient wrote a suicide note to his sister.

## 2016-05-31 NOTE — ED Provider Notes (Signed)
[facility] Emergency Department Provider Note  ____________________________________________   First MD Initiated Contact with Patient 05/31/16 [phone]     (approximate)  I have reviewed the triage vital signs and the nursing notes.   Level 5 caveat:  history/ROS limited by acute intoxication (polysubstances)   HISTORY  Chief Complaint Medical Clearance    HPI Jesse Kane is a 31 y.o. male with no specific history who presents under involuntary commitment for depression and suicidal ideation.  He reports that he has been having relationship problems and is having no move out as his relationship with his girlfriend has come to an end.  He states that he "let his emotions get the better of him" and he wrote a letter to his sister with no specific threats but with vague hence of suicidal ideation.  He admits that he was thinking about killing himself with no specific plan.  He used cocaine yesterday and marijuana today.  He has not been using alcohol.  He admits that his symptoms are severe and that he needs help.  He has no medical complaints as listed below in the review of systems.   History reviewed. No pertinent past medical history.  There are no active problems to display for this patient.   History reviewed. No pertinent surgical history.  Prior to Admission medications   Medication Sig Start Date End Date Taking? Authorizing Provider  HYDROcodone-acetaminophen (NORCO/VICODIN) 5-325 MG tablet Take 1 tablet by mouth every 4 (four) hours as needed for moderate pain. 01/09/15   Minna Antis, MD  penicillin v potassium (VEETID) 500 MG tablet Take 1 tablet (500 mg total) by mouth 4 (four) times daily. 01/09/15   Minna Antis, MD  promethazine (PHENERGAN) 12.5 MG tablet Take 1 tablet (12.5 mg total) by mouth every 6 (six) hours as needed for nausea or vomiting. 12/31/15   Willy Eddy, MD  sulfamethoxazole-trimethoprim (BACTRIM DS,SEPTRA DS)  800-160 MG per tablet Take 1 tablet by mouth 2 (two) times daily. 09/14/14   Myrna Blazer, MD    Allergies Patient has no known allergies.  No family history on file.  Social History Social History  Substance Use Topics  . Smoking status: Current Every Day Smoker    Types: Cigarettes  . Smokeless tobacco: Never Used  . Alcohol use Yes     Comment: rarely    Review of Systems Constitutional: No fever/chills Eyes: No visual changes. ENT: No sore throat. Cardiovascular: Denies chest pain. Respiratory: Denies shortness of breath. Gastrointestinal: No abdominal pain.  No nausea, no vomiting.  No diarrhea.  No constipation. Genitourinary: Negative for dysuria. Musculoskeletal: Negative for back pain. Skin: Negative for rash. Neurological: Negative for headaches, focal weakness or numbness. Psychiatric: Worsening depression and suicidal ideation with no specific plan 10-point ROS otherwise negative.  ____________________________________________   PHYSICAL EXAM:  ED Triage Vitals  Enc Vitals Group     BP 05/31/16 0601 108/65     Pulse Rate 05/31/16 0601 78     Resp 05/31/16 0601 18     Temp 05/31/16 0601 98 F (36.7 C)     Temp Source 05/31/16 0601 Oral     SpO2 05/31/16 0601 98 %     Weight 05/31/16 0248 235 lb (106.6 kg)     Height 05/31/16 0248  (1.88 m)     Head Circumference --      Peak Flow --      Pain Score 05/31/16 0248 0     Pain  Loc --      Pain Edu? --      Excl. in GC? --       Constitutional: Alert and oriented. Well appearing and in no acute distress. Eyes: Conjunctivae are normal. PERRL. EOMI. Head: Atraumatic. Nose: No congestion/rhinnorhea. Mouth/Throat: Mucous membranes are moist. Neck: No stridor.  No meningeal signs.   Cardiovascular: Normal rate, regular rhythm. Good peripheral circulation. Grossly normal heart sounds. Respiratory: Normal respiratory effort.  No retractions. Lungs CTAB. Gastrointestinal: Soft and  nontender. No distention.  Musculoskeletal: No lower extremity tenderness nor edema. No gross deformities of extremities. Neurologic:  Normal speech and language. No gross focal neurologic deficits are appreciated.  Skin:  Skin is warm, dry and intact. No rash noted. Psychiatric: Mood and affect are normal. Speech and behavior are normal.  Calm and cooperative.  Admits to gradually worsening depression and suicidal ideation, now improved, as well as recent drug use  ____________________________________________   LABS (all labs ordered are listed, but only abnormal results are displayed)  Labs Reviewed  COMPREHENSIVE METABOLIC PANEL - Abnormal; Notable for the following:       Result Value   Potassium 3.1 (*)    All other components within normal limits  ACETAMINOPHEN LEVEL - Abnormal; Notable for the following:    Acetaminophen (Tylenol), Serum <10 (*)    All other components within normal limits  URINE DRUG SCREEN, QUALITATIVE ([facility] ONLY) - Abnormal; Notable for the following:    Cocaine Metabolite,Ur [place] POSITIVE (*)    Cannabinoid 50 Ng, Ur [place] POSITIVE (*)    All other components within normal limits  ETHANOL  CBC WITH DIFFERENTIAL/PLATELET  SALICYLATE LEVEL   ____________________________________________  EKG   ____________________________________________  RADIOLOGY   No results found.  ____________________________________________   PROCEDURES  Critical Care performed: No   Procedure(s) performed:   Procedures   ____________________________________________   INITIAL IMPRESSION / ASSESSMENT AND PLAN / ED COURSE  Pertinent labs & imaging results that were available during my care of the patient were reviewed by me and considered in my medical decision making (see chart for details).  Worsening depression over extended period of time with intermittent suicidality and polysubstance abuse.  I will uphold is involuntary commitment and consult psychiatry and  TTS.  No indication of any acute or emergent medical condition at this time.  Lab workup reassuring.      ____________________________________________  FINAL CLINICAL IMPRESSION(S) / ED DIAGNOSES  Final diagnoses:  Depression, unspecified depression type  Suicidal ideation  Polysubstance abuse     MEDICATIONS GIVEN DURING THIS VISIT:  Medications - No data to display   NEW OUTPATIENT MEDICATIONS STARTED DURING THIS VISIT:  New Prescriptions   No medications on file    Modified Medications   No medications on file    Discontinued Medications   No medications on file     Note:  This document was prepared using Dragon voice recognition software and may include unintentional dictation errors.    Loleta Rose, MD 05/31/16 [phone]

## 2016-05-31 NOTE — ED Notes (Signed)
BEHAVIORAL HEALTH ROUNDING Patient sleeping: Yes.   Patient alert and oriented: not applicable SLEEPING Behavior appropriate: Yes.  ; If no, describe: SLEEPING Nutrition and fluids offered: No SLEEPING Toileting and hygiene offered: NoSLEEPING Sitter present: not applicable, Q 15 min safety rounds and observation. Law enforcement present: Yes ODS 

## 2016-05-31 NOTE — ED Notes (Signed)
BEHAVIORAL HEALTH ROUNDING  Patient sleeping: No.  Patient alert and oriented: yes  Behavior appropriate: Yes. ; If no, describe:  Nutrition and fluids offered: Yes  Toileting and hygiene offered: Yes  Sitter present: not applicable, Q 15 min safety rounds and observation.  Law enforcement present: Yes ODS  

## 2016-05-31 NOTE — BH Assessment (Addendum)
Writer received a phone call from [facility] [phone]) about patient. They are requesting a EKG and have it faxed to them ([phone]). Writer informed ER MD (Dr. Lloyd Huger) and patient's nurse Clydie Braun).   Information faxed and confirmed it was received ([phone]).

## 2016-05-31 NOTE — ED Notes (Signed)

## 2016-05-31 NOTE — ED Notes (Signed)
Pt resting in bed, resp even and unlabored, eyes closed 

## 2016-05-31 NOTE — ED Notes (Signed)

## 2016-05-31 NOTE — ED Notes (Signed)
SOC set up in room for consult

## 2016-05-31 NOTE — ED Notes (Signed)
Report was received from Colin Broach., RN; Pt. Verbalizes no complaints or distress; verbalizes having S.I.; denies having Hi. Continue to monitor with 15 min. Monitoring.

## 2016-05-31 NOTE — ED Notes (Signed)
SOC complete, pt resting in bed eating breakfast, denies any needs at this time, [facility] computer removed from room

## 2016-05-31 NOTE — BH Assessment (Signed)
Referral information for Psychiatric Hospitalization faxed to;    [facility] [phone])   [facility] [phone]),    [facility] [phone]),    [address] [phone]),    [facility] [phone]),    [facility] [phone]),    [facility] [phone]).   [facility] [phone])   [phone])   [facility]   [place] [phone])   [facility])

## 2016-05-31 NOTE — ED Notes (Signed)
Pt given breakfast ray at this time.

## 2016-05-31 NOTE — BH Assessment (Signed)
Assessment Note  Jesse Kane is an 31 y.o. male who presents to the ER due to his sister having concerns about his mental and emotional state. Patient wrote a letter that suggested he was going to commit suicide. Per the sister [phone]), the letter started with apologizing "for what he is going to do" and forgive him for it and telling certain family members "goodbye." Also stated, "this" was the "only way out" and the "only way I can stop disappointing" his family. It also gave specific instructions, detailing what to do with his belongings. Sister states she gave the letter to law enforcement, when they came to transport him to carry out the IVC.  Per the report of the patient, his sister "over reacted" and he didn't mean for the letter to alarm anyone. He states, he didn't want to end his life. He denies past and present attempts.  Patient did not mention the letter, until Clinical research associate asked about it. Patient states, he was asked to move out of his home, by his ex-girlfriend. He was under the understanding; he had to the end of April to move out, but had less than 24 hours. "I got my car packed and there was no more room for nothing else. The letter was telling my family what to do with my stuff." Patient did not mentioned anything about saying "goodbye," nor did he mention he wanted his family to "forgive" him for what he was going to do."  During the interview, the patient was calm, cooperative and pleasant. He was able to answer questions with appropriate answers, even though they were conflicting with other reports giving. Patient denies having a history of aggression and violence. He have no current involvement with the legal system and will DSS.  Patient admits to abusing cocaine, cannabis and alcohol.  Patient have several risk factors; including at risk of homelessness, substance use, conflicting reports and history of poor coping skills and emotional regulation.  Diagnosis: Depression &  Substance Use Disorder  Past Medical History: History reviewed. No pertinent past medical history.  History reviewed. No pertinent surgical history.  Family History: No family history on file.  Social History:  reports that he has been smoking Cigarettes.  He has never used smokeless tobacco. He reports that he drinks alcohol. He reports that he does not use drugs.  Additional Social History:  Alcohol / Drug Use Pain Medications: See PTA Prescriptions: See PTA Over the Counter: See PTA History of alcohol / drug use?: Yes Longest period of sobriety (when/how long): Unable to quanfity Negative Consequences of Use:  (n/a) Substance #1 Name of Substance 1: Alcohol 1 - Age of First Use: 19 1 - Amount (size/oz): "2oz shots" 1 - Frequency: Once a week 1 - Duration: Unable to quantify 1 - Last Use / Amount: 05/30/2016 Substance #2 Name of Substance 2: Cannabis 2 - Age of First Use: 18 2 - Amount (size/oz): "A blunt. So I think about a gram."  2 - Frequency: "3 times a week" 2 - Duration: "Three months now." 2 - Last Use / Amount: 05/30/2016 Substance #3 Name of Substance 3: Cocaine 3 - Age of First Use: 20 3 - Amount (size/oz): Unable to quantify 3 - Frequency: Two time use 3 - Duration: Used only two times 3 - Last Use / Amount: 05/29/2016  CIWA: CIWA-Ar BP: 108/65 Pulse Rate: 78 COWS:    Allergies: No Known Allergies  Home Medications:  (Not in a hospital admission)  OB/GYN Status:  No  LMP for male patient.  General Assessment Data Location of Assessment: [facility] ED TTS Assessment: In system Is this a Tele or Face-to-Face Assessment?: Face-to-Face Is this an Initial Assessment or a Re-assessment for this encounter?: Initial Assessment Marital status: Long term relationship Maiden name: n/a Is patient pregnant?: No Pregnancy Status: No Living Arrangements: Other (Comment) (Recently had to move) Can pt return to current living arrangement?: Yes Admission Status:  Involuntary Is patient capable of signing voluntary admission?: No (Under IVC) Referral Source: Self/Family/Friend Insurance type: n/a  Medical Screening Exam [facility] Walk-in ONLY) Medical Exam completed: Yes  Crisis Care Plan Living Arrangements: Other (Comment) (Recently had to move) Legal Guardian: Other: (Self) Name of Psychiatrist: Reports of none Name of Therapist: Reports of none  Education Status Is patient currently in school?: No Current Grade: n/a Highest grade of school patient has completed: 12th Grade Diploma Name of school: n/a Contact person: n/a  Risk to self with the past 6 months Suicidal Ideation: No-Not Currently/Within Last 6 Months Has patient been a risk to self within the past 6 months prior to admission? : Yes Suicidal Intent: No Has patient had any suicidal intent within the past 6 months prior to admission? : No Is patient at risk for suicide?: Yes Suicidal Plan?: No Has patient had any suicidal plan within the past 6 months prior to admission? : No Specify Current Suicidal Plan: None reported Access to Means: No What has been your use of drugs/alcohol within the last 12 months?: Alcohol, Cannabis & Cocaine Previous Attempts/Gestures: No How many times?: 0 Other Self Harm Risks: Active Addiction Triggers for Past Attempts: None known Intentional Self Injurious Behavior: None Family Suicide History: No Recent stressful life event(s): Loss (Comment), Financial Problems, Conflict (Comment), Other (Comment) (Recent breakup) Persecutory voices/beliefs?: No Depression: No Depression Symptoms:  (Reports of none) Substance abuse history and/or treatment for substance abuse?: Yes Suicide prevention information given to non-admitted patients: Not applicable  Risk to Others within the past 6 months Homicidal Ideation: No Does patient have any lifetime risk of violence toward others beyond the six months prior to admission? : No Thoughts of Harm to Others:  No Current Homicidal Intent: No Current Homicidal Plan: No Access to Homicidal Means: No Identified Victim: Reports of none History of harm to others?: No Assessment of Violence: None Noted Violent Behavior Description: Reports of none Does patient have access to weapons?: No Criminal Charges Pending?: No Does patient have a court date: No Is patient on probation?: No  Psychosis Hallucinations: None noted Delusions: None noted  Mental Status Report Appearance/Hygiene: Unremarkable, In scrubs Eye Contact: Fair Motor Activity: Freedom of movement, Unremarkable Speech: Logical/coherent Level of Consciousness: Alert Mood: Depressed, Sad, Pleasant, Anxious Affect: Appropriate to circumstance, Depressed, Sad Anxiety Level: Minimal Thought Processes: Coherent, Relevant Judgement: Unimpaired Orientation: Person, Place, Time, Situation, Appropriate for developmental age Obsessive Compulsive Thoughts/Behaviors: Minimal  Cognitive Functioning Concentration: Normal Memory: Recent Intact, Remote Intact IQ: Average Insight: Fair Impulse Control: Fair Appetite: Good Weight Loss: 0 Weight Gain: 0 Sleep: No Change Total Hours of Sleep: 7 Vegetative Symptoms: None  ADLScreening [facility] Assessment Services) Patient's cognitive ability adequate to safely complete daily activities?: Yes Patient able to express need for assistance with ADLs?: Yes Independently performs ADLs?: Yes (appropriate for developmental age)  Prior Inpatient Therapy Prior Inpatient Therapy: No Prior Therapy Dates: Reports of none Prior Therapy Facilty/Provider(s): Reports of none Reason for Treatment: Reports of none  Prior Outpatient Therapy Prior Outpatient Therapy: No Prior Therapy Dates: Reports of  none Prior Therapy Facilty/Provider(s): Reports of none Reason for Treatment: Reports of none Does patient have an ACCT team?: No Does patient have Intensive In-House Services?  : No Does patient have  [facility] services? : No Does patient have P4CC services?: No  ADL Screening (condition at time of admission) Patient's cognitive ability adequate to safely complete daily activities?: Yes Is the patient deaf or have difficulty hearing?: No Does the patient have difficulty seeing, even when wearing glasses/contacts?: No Does the patient have difficulty concentrating, remembering, or making decisions?: No Patient able to express need for assistance with ADLs?: Yes Does the patient have difficulty dressing or bathing?: No Independently performs ADLs?: Yes (appropriate for developmental age) Does the patient have difficulty walking or climbing stairs?: No Weakness of Legs: None Weakness of Arms/Hands: None  Home Assistive Devices/Equipment Home Assistive Devices/Equipment: None  Therapy Consults (therapy consults require a physician order) PT Evaluation Needed: No OT Evalulation Needed: No SLP Evaluation Needed: No Abuse/Neglect Assessment (Assessment to be complete while patient is alone) Physical Abuse: Denies Verbal Abuse: Denies Sexual Abuse: Denies Exploitation of patient/patient's resources: Denies Self-Neglect: Denies Values / Beliefs Cultural Requests During Hospitalization: None Spiritual Requests During Hospitalization: None Consults Spiritual Care Consult Needed: No Social Work Consult Needed: No      Additional Information 1:1 In Past 12 Months?: No CIRT Risk: No Elopement Risk: No Does patient have medical clearance?: Yes  Child/Adolescent Assessment Running Away Risk: Denies (Patient is an adult)  Disposition:  Disposition Initial Assessment Completed for this Encounter: Yes Disposition of Patient: Other dispositions (ER MD Ordered Psych Consult)  On Site Evaluation by:   Reviewed with Physician:    Lilyan Gilford MS, LCAS, LPC, NCC, CCSI Therapeutic Triage Specialist 05/31/2016 12:33 PM

## 2016-05-31 NOTE — ED Notes (Signed)
Pt reports he as been splitting up with his girlfriend over the last few weeks. Pt reports they had decided a few days ago that he would move out by the end of this month. Pt reports on Saturday morning 05/30/16 she told him he needed to move out by the end of the weekend. Pt reports he became upset and let "my emotions get the best of me". Pt reports he wrote a note giving away possessions and made vague comments about not being around. Pt denies SI/HI at this time.

## 2016-06-01 NOTE — ED Notes (Signed)
Spoke with Dr. Leonides Grills re K+ of 3.1.  She advised not low enough to treat at this time.  Patient will be transferred to [facility] later in the morning.  Called report to [place] at [place].

## 2016-06-01 NOTE — ED Provider Notes (Signed)
-----------------------------------------   7:15 AM on 06/01/2016 -----------------------------------------   Blood pressure 126/67, pulse (!) 102, temperature 97.7 F (36.5 C), temperature source Oral, resp. rate 16, height  (1.88 m), weight 235 lb (106.6 kg), SpO2 97 %.  The patient had no acute events since last update.  Calm and cooperative at this time.  Disposition is pending Psychiatry/Behavioral Medicine team recommendations.  The patient is accepted to [facility]   Rebecka Apley, MD 06/01/16 [phone]

## 2016-06-01 NOTE — ED Notes (Signed)
Patient sleeping at this time.  Breakfast was given.

## 2016-06-01 NOTE — ED Notes (Signed)
Patient was informed that he is going to [facility].  He gave verbal consent to this nurse to speak to his sister.  Patient states, "I really did something stupid.  I only wrote that letter to my family because it was a last ditch effort to stay instead of getting kicked out."  Patient has been cooperative; his behavior is appropriate.  He accepts the fact that he is going to [place].  Informed patient that he will transported later today.  Patient denies any thoughts of self harm.  He denies HI and does not appear to be responding to internal stimuli.

## 2016-06-01 NOTE — ED Notes (Signed)
All transfer information completed by am RN.  Patient informed of transfer.

## 2016-06-01 NOTE — ED Notes (Signed)
Transportation called Licensed conveyancer to inform that transfer will occur after 1930.  Informed patient.

## 2017-03-04 ENCOUNTER — Emergency Department: Payer: Worker's Compensation

## 2017-03-04 ENCOUNTER — Encounter: Payer: Self-pay | Admitting: Emergency Medicine

## 2017-03-04 ENCOUNTER — Emergency Department
Admission: EM | Admit: 2017-03-04 | Discharge: 2017-03-04 | Disposition: A | Discharge status: Home / Self Care | Payer: Worker's Compensation | Attending: Student in an Organized Health Care Education/Training Program | Admitting: Student in an Organized Health Care Education/Training Program

## 2017-03-04 ENCOUNTER — Other Ambulatory Visit: Payer: Self-pay

## 2017-03-04 DIAGNOSIS — S61313A Laceration without foreign body of left middle finger with damage to nail, initial encounter: Secondary | ICD-10-CM | POA: Diagnosis not present

## 2017-03-04 DIAGNOSIS — Z79899 Other long term (current) drug therapy: Secondary | ICD-10-CM | POA: Insufficient documentation

## 2017-03-04 DIAGNOSIS — Y929 Unspecified place or not applicable: Secondary | ICD-10-CM | POA: Diagnosis not present

## 2017-03-04 DIAGNOSIS — S61319A Laceration without foreign body of unspecified finger with damage to nail, initial encounter: Secondary | ICD-10-CM

## 2017-03-04 DIAGNOSIS — W108XXA Fall (on) (from) other stairs and steps, initial encounter: Secondary | ICD-10-CM | POA: Diagnosis not present

## 2017-03-04 DIAGNOSIS — S62639B Displaced fracture of distal phalanx of unspecified finger, initial encounter for open fracture: Secondary | ICD-10-CM | POA: Diagnosis not present

## 2017-03-04 DIAGNOSIS — S61317A Laceration without foreign body of left little finger with damage to nail, initial encounter: Secondary | ICD-10-CM | POA: Diagnosis not present

## 2017-03-04 DIAGNOSIS — Y990 Civilian activity done for income or pay: Secondary | ICD-10-CM | POA: Diagnosis not present

## 2017-03-04 DIAGNOSIS — Z23 Encounter for immunization: Secondary | ICD-10-CM | POA: Diagnosis not present

## 2017-03-04 DIAGNOSIS — F17210 Nicotine dependence, cigarettes, uncomplicated: Secondary | ICD-10-CM | POA: Diagnosis not present

## 2017-03-04 DIAGNOSIS — S6722XA Crushing injury of left hand, initial encounter: Secondary | ICD-10-CM | POA: Diagnosis present

## 2017-03-04 DIAGNOSIS — Y9389 Activity, other specified: Secondary | ICD-10-CM | POA: Insufficient documentation

## 2017-03-04 MED ORDER — TETANUS-DIPHTH-ACELL PERTUSSIS 5-2.5-18.5 LF-MCG/0.5 IM SUSP
INTRAMUSCULAR | Status: AC
  Administered 2017-03-04: 0.5 mL via INTRAMUSCULAR
  Filled 2017-03-04: qty 0.5

## 2017-03-04 MED ORDER — BUPIVACAINE HCL (PF) 0.5 % IJ SOLN
30.0000 mL | Freq: Once | INTRAMUSCULAR | Status: AC
  Administered 2017-03-04: 30 mL

## 2017-03-04 MED ORDER — TETANUS-DIPHTH-ACELL PERTUSSIS 5-2.5-18.5 LF-MCG/0.5 IM SUSP
0.5000 mL | Freq: Once | INTRAMUSCULAR | Status: AC
  Administered 2017-03-04: 0.5 mL via INTRAMUSCULAR

## 2017-03-04 MED ORDER — MORPHINE SULFATE (PF) 4 MG/ML IV SOLN
8.0000 mg | INTRAVENOUS | Status: DC | PRN
  Administered 2017-03-04: 8 mg via INTRAMUSCULAR

## 2017-03-04 MED ORDER — BUPIVACAINE HCL (PF) 0.5 % IJ SOLN
INTRAMUSCULAR | Status: AC
  Administered 2017-03-04: 30 mL
  Filled 2017-03-04: qty 30

## 2017-03-04 MED ORDER — CEPHALEXIN 500 MG PO CAPS: 500.0000 mg | ORAL_CAPSULE | Freq: Three times a day (TID) | ORAL | 0 refills | Status: AC

## 2017-03-04 MED ORDER — HYDROCODONE-ACETAMINOPHEN 5-325 MG PO TABS: 1.0000 | ORAL_TABLET | ORAL | 0 refills | Status: DC | PRN

## 2017-03-04 MED ORDER — MORPHINE SULFATE (PF) 4 MG/ML IV SOLN
INTRAVENOUS | Status: AC
  Filled 2017-03-04: qty 2

## 2017-03-04 NOTE — ED Notes (Signed)
Pt states spoke with [place], and per [place], they are now requesting a drug screen.

## 2017-03-04 NOTE — ED Notes (Signed)
Pt does not have photo ID at this time. Jesse Kane Sitton, contacted and informed that a supervisor would need to come to hospital to physically verify patient identity. Jesse Kane states understanding. States will send someone.

## 2017-03-04 NOTE — ED Notes (Signed)
X-ray at bedside at this time. Pt instructed after X-ray to place his hand into a sterile water/surgical scrub mix per MD order. Pt states understanding.

## 2017-03-04 NOTE — ED Notes (Signed)
MD at bedside to sew pt's hand.

## 2017-03-04 NOTE — ED Triage Notes (Signed)
Pt reports was at work carrying an oxygen tank and tripped over wire injuring left hand. Pt sent from [facility] for evaluation of open fracture.

## 2017-03-04 NOTE — ED Notes (Signed)
Oda KiltsMark Machia, pt's supervisor at bedside to verify patient's identity for worker's comp drug screen.

## 2017-03-04 NOTE — Discharge Instructions (Signed)
Please follow-up with Dr. Stephenie AcresSoria in clinic.  Return immediately for worsening pain, fevers.  Be sure to take antibiotics and if not able to get into her clinic within 48hr I would recommend being evaluated here wound check.

## 2017-03-04 NOTE — ED Provider Notes (Signed)
[facility] Emergency Department Provider Note    None    (approximate)  I have reviewed the triage vital signs and the nursing notes.   HISTORY  Chief Complaint Hand Injury    HPI Jesse Kane is a 32 y.o. 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  There is no prolonged crush injury.  Denies any head injury.  No other injury reported.  Patient reports moderate to severe pain to the left hand.  No numbness or tingling but feels that it is swelling.  Does not remember his last tetanus.  Injury occurred while at work roughly 1 hour prior to arrival.  History reviewed. No pertinent past medical history. No family history on file. History reviewed. No pertinent surgical history. There are no active problems to display for this patient.     Prior to Admission medications   Medication Sig Start Date End Date Taking? Authorizing Provider  HYDROcodone-acetaminophen (NORCO/VICODIN) 5-325 MG tablet Take 1 tablet by mouth every 4 (four) hours as needed for moderate pain. 01/09/15   Minna Antis, MD  penicillin v potassium (VEETID) 500 MG tablet Take 1 tablet (500 mg total) by mouth 4 (four) times daily. 01/09/15   Minna Antis, MD  promethazine (PHENERGAN) 12.5 MG tablet Take 1 tablet (12.5 mg total) by mouth every 6 (six) hours as needed for nausea or vomiting. 12/31/15   Willy Eddy, MD  sulfamethoxazole-trimethoprim (BACTRIM DS,SEPTRA DS) 800-160 MG per tablet Take 1 tablet by mouth 2 (two) times daily. 09/14/14   Schaevitz, Myra Rude, MD    Allergies Patient has no known allergies.    Social History Social History   Tobacco Use  . Smoking status: Current Every Day Smoker    Types: Cigarettes  . Smokeless tobacco: Never Used  Substance Use Topics  .  Alcohol use: Yes    Comment: rarely  . Drug use: No    Review of Systems Patient denies headaches, rhinorrhea, blurry vision, numbness, shortness of breath, chest pain, edema, cough, abdominal pain, nausea, vomiting, diarrhea, dysuria, fevers, rashes or hallucinations unless otherwise stated above in HPI. ____________________________________________   PHYSICAL EXAM:  VITAL SIGNS: Vitals:   03/04/17 0841  BP: (!) 110/93  Pulse: 95  Resp: 18  Temp: 98.2 F (36.8 C)  SpO2: 98%    Constitutional: Alert and oriented. Well appearing and in no acute distress. Eyes: Conjunctivae are normal.  Head: Atraumatic. Nose: No congestion/rhinnorhea. Mouth/Throat: Mucous membranes are moist.   Neck: No stridor. Painless ROM.  Cardiovascular: Normal rate, regular rhythm. Grossly normal heart sounds.  Good peripheral circulation. Respiratory: Normal respiratory effort.  No retractions. Lungs CTAB. Gastrointestinal: Soft and nontender. No distention. No abdominal bruits. No CVA tenderness. Genitourinary:  Musculoskeletal: Left hand with contusion and blood blistering to the dorsal aspect of the third digit as well as fifth digit.  Sensation is intact on both ulnar and radial aspects of bilateral hands in all 5 digits.  Flexor mechanism seems intact in all 5 digits.  No evidence of arterial injury.  Does have some extensor weakness of the fifth digit at the DIP with associated nailbed injury where the pinky nail is nearly completely avulsed with underlying laceration.  Has a subungual hematoma of roughly 50% of the third digit.  No rotational deformity.  No  lower extremity tenderness nor edema.  No joint effusions. Neurologic:  Normal speech and language. No gross focal neurologic deficits are appreciated. No facial droop Skin:  Skin is warm, dry and intact. No rash noted. Psychiatric: Mood and affect are normal. Speech and behavior are normal.  ____________________________________________    LABS (all labs ordered are listed, but only abnormal results are displayed)  No results found for this or any previous visit (from the past 24 hour(s)). ____________________________________________ ____________________________________________  RADIOLOGY  I personally reviewed all radiographic images ordered to evaluate for the above acute complaints and reviewed radiology reports and findings.  These findings were personally discussed with the patient.  Please see medical record for radiology report.  ____________________________________________   PROCEDURES  Procedure(s) performed:  Marland Kitchen.Marland Kitchen.Laceration Repair Date/Time: 03/04/2017 10:54 AM Performed by: Willy Eddyobinson, Mohammed Mcandrew, MD Authorized by: Willy Eddyobinson, Doris Gruhn, MD   Consent:    Consent obtained:  Verbal   Consent given by:  Patient   Risks discussed:  Infection, pain, retained foreign body, poor cosmetic result and poor wound healing Anesthesia (see MAR for exact dosages):    Anesthesia method:  Local infiltration and nerve block   Local anesthetic:  Bupivacaine 0.5% w/o epi   Block location:  Left wrist   Block needle gauge:  25 G   Block anesthetic:  Bupivacaine 0.5% w/o epi   Block technique:  US guided block of Median and Ulnar nerves Laceration details:    Location:  Finger   Finger location: nail bed injury with avulsion of nail and underlying nailbed laceration to 3rd and 5th digits.   Wound length (cm): 2cm total.   Depth (mm):  4 Repair type:    Repair type:  Intermediate Pre-procedure details:    Preparation:  Patient was prepped and draped in usual sterile fashion Exploration:    Hemostasis achieved with:  Direct pressure   Wound exploration: entire depth of wound probed and visualized     Contaminated: no   Treatment:    Area cleansed with:  Saline, Hibiclens and Betadine   Amount of cleaning:  Extensive   Irrigation solution:  Sterile saline   Visualized foreign bodies/material removed: no   Skin repair:     Repair method:  Sutures   Suture material:  Prolene (prolene and fast absorbing gut)   Suture technique:  Simple interrupted   Number of sutures:  10 Approximation:    Approximation:  Loose Post-procedure details:    Dressing:  Sterile dressing and non-adherent dressing   Patient tolerance of procedure:  Tolerated well, no immediate complications      Critical Care performed: no ____________________________________________   INITIAL IMPRESSION / ASSESSMENT AND PLAN / ED COURSE  Pertinent labs & imaging results that were available during my care of the patient were reviewed by me and considered in my medical decision making (see chart for details).  DDX: Ration fracture, dislocation, avulsion  Bosie ClosRocky Hawkes is a 32 y.o. who presents to the ED with pain injury to nondominant hand as described above.  Nailbed repaired in place.  Had significant bulge and injury to the fifth digit nailbed therefore it was most loosely approximated and covered with replacement of nail after copious irrigation and cleansing.  Tdap was updated.  Patient will have follow-up with orthopedics.  Discussed signs and symptoms for which he should return to the ER.  Have discussed with the patient and available family all diagnostics and treatments performed thus far and all questions were answered to the best of my  ability. The patient demonstrates understanding and agreement with plan.       ____________________________________________   FINAL CLINICAL IMPRESSION(S) / ED DIAGNOSES  Final diagnoses:  Laceration of nail bed of finger, initial encounter  Open fracture of tuft of distal phalanx of finger      NEW MEDICATIONS STARTED DURING THIS VISIT:  New Prescriptions   No medications on file     Note:  This document was prepared using Dragon voice recognition software and may include unintentional dictation errors.    Willy Eddy, MD 03/04/17 1100

## 2017-03-04 NOTE — ED Notes (Signed)
NAD noted at time of D/C. Pt ambulatory to the lobby without difficulty with his sister. This RN instructed patient per MD leave dressing intact until follow up with ortho. Pt states understanding, pt also states understanding that he is to follow up with ortho within 48 hrs. Pt denies any comments/concerns regarding D/C at this time.

## 2017-03-04 NOTE — ED Notes (Addendum)
[facility] documentation of speaking with Oda KiltsMark Machia at approx [phone] this morning and denying needing a UDS placed into chart with patient label.

## 2017-06-06 ENCOUNTER — Emergency Department: Payer: Self-pay

## 2017-06-06 ENCOUNTER — Emergency Department
Admission: EM | Admit: 2017-06-06 | Discharge: 2017-06-06 | Disposition: A | Discharge status: Home / Self Care | Payer: Self-pay | Attending: Emergency Medicine | Admitting: Emergency Medicine

## 2017-06-06 ENCOUNTER — Encounter: Payer: Self-pay | Admitting: Emergency Medicine

## 2017-06-06 ENCOUNTER — Other Ambulatory Visit: Payer: Self-pay

## 2017-06-06 DIAGNOSIS — R51 Headache: Secondary | ICD-10-CM | POA: Insufficient documentation

## 2017-06-06 DIAGNOSIS — R519 Headache, unspecified: Secondary | ICD-10-CM

## 2017-06-06 DIAGNOSIS — F17210 Nicotine dependence, cigarettes, uncomplicated: Secondary | ICD-10-CM | POA: Insufficient documentation

## 2017-06-06 DIAGNOSIS — H6692 Otitis media, unspecified, left ear: Secondary | ICD-10-CM | POA: Insufficient documentation

## 2017-06-06 LAB — COMPREHENSIVE METABOLIC PANEL
ALBUMIN: 4 g/dL (ref 3.5–5.0)
ALT: 23 U/L (ref 17–63)
ANION GAP: 7 (ref 5–15)
AST: 23 U/L (ref 15–41)
Alkaline Phosphatase: 81 U/L (ref 38–126)
BUN: 6 mg/dL (ref 6–20)
CHLORIDE: 95 mmol/L — AB (ref 101–111)
CO2: 29 mmol/L (ref 22–32)
Calcium: 8.6 mg/dL — ABNORMAL LOW (ref 8.9–10.3)
Creatinine, Ser: 0.71 mg/dL (ref 0.61–1.24)
GFR calc Af Amer: >60 mL/min (ref >60)
GFR calc non Af Amer: >60 mL/min (ref >60)
GLUCOSE: 101 mg/dL — AB (ref 65–99)
POTASSIUM: 4.1 mmol/L (ref 3.5–5.1)
Sodium: 131 mmol/L — ABNORMAL LOW (ref 135–145)
Total Bilirubin: 0.8 mg/dL (ref 0.3–1.2)
Total Protein: 6.9 g/dL (ref 6.5–8.1)

## 2017-06-06 LAB — CBC WITH DIFFERENTIAL/PLATELET
BASOS ABS: 0.1 10*3/uL (ref 0–0.1)
BASOS PCT: 0 %
EOS ABS: 0.2 10*3/uL (ref 0–0.7)
Eosinophils Relative: 1 %
HCT: 39.6 % — ABNORMAL LOW (ref 40.0–52.0)
Hemoglobin: 13.6 g/dL (ref 13.0–18.0)
Lymphocytes Relative: 20 %
Lymphs Abs: 2.2 10*3/uL (ref 1.0–3.6)
MCH: 29.5 pg (ref 26.0–34.0)
MCHC: 34.4 g/dL (ref 32.0–36.0)
MCV: 85.7 fL (ref 80.0–100.0)
MONO ABS: 0.9 10*3/uL (ref 0.2–1.0)
MONOS PCT: 8 %
NEUTROS ABS: 7.9 10*3/uL — AB (ref 1.4–6.5)
NEUTROS PCT: 71 %
Platelets: 190 10*3/uL (ref 150–440)
RBC: 4.62 MIL/uL (ref 4.40–5.90)
RDW: 12.9 % (ref 11.5–14.5)
WBC: 11.3 10*3/uL — ABNORMAL HIGH (ref 3.8–10.6)

## 2017-06-06 MED ORDER — DEXAMETHASONE SODIUM PHOSPHATE 10 MG/ML IJ SOLN
10.0000 mg | Freq: Once | INTRAMUSCULAR | Status: AC
  Administered 2017-06-06: 10 mg via INTRAVENOUS
  Filled 2017-06-06: qty 1

## 2017-06-06 MED ORDER — SODIUM CHLORIDE 0.9 % IV SOLN
1.0000 g | Freq: Once | INTRAVENOUS | Status: AC
  Administered 2017-06-06: 1 g via INTRAVENOUS
  Filled 2017-06-06 (×2): qty 10

## 2017-06-06 MED ORDER — HYDROMORPHONE HCL 1 MG/ML IJ SOLN
0.5000 mg | Freq: Once | INTRAMUSCULAR | Status: AC
  Administered 2017-06-06: 0.5 mg via INTRAVENOUS

## 2017-06-06 MED ORDER — IBUPROFEN 800 MG PO TABS: 800.0000 mg | ORAL_TABLET | Freq: Three times a day (TID) | ORAL | 0 refills | Status: DC | PRN

## 2017-06-06 MED ORDER — HYDROMORPHONE HCL 1 MG/ML IJ SOLN
INTRAMUSCULAR | Status: AC
  Filled 2017-06-06: qty 1

## 2017-06-06 MED ORDER — AZITHROMYCIN 250 MG PO TABS: ORAL_TABLET | ORAL | 0 refills | Status: DC

## 2017-06-06 MED ORDER — PREDNISONE 10 MG (21) PO TBPK: ORAL_TABLET | Freq: Every day | ORAL | 0 refills | Status: DC

## 2017-06-06 MED ORDER — KETOROLAC TROMETHAMINE 30 MG/ML IJ SOLN
30.0000 mg | Freq: Once | INTRAMUSCULAR | Status: AC
  Administered 2017-06-06: 30 mg via INTRAVENOUS
  Filled 2017-06-06: qty 1

## 2017-06-06 NOTE — ED Triage Notes (Signed)
Pt arrives ambulatory to triage with c/o HA since last night. Pt states that his whole head is throbbing at this time. Pt is in NAD.

## 2017-06-06 NOTE — ED Provider Notes (Signed)
[facility] Emergency Department Provider Note       Time seen: ----------------------------------------- 9:13 PM on 06/06/2017 -----------------------------------------   I have reviewed the triage vital signs and the nursing notes.  HISTORY   Chief Complaint Headache    HPI Tristain Daily is a 32 y.o. male with a history of migraines who presents to the ED for left-sided frontal temporal headache as well as neck pain and left ear pain.  Symptoms been going on for the past day.  He has been taking Tylenol without any improvement.  He arrives tearful, complaining of a severe throbbing left-sided headache.  He complains of vomiting and light sensitivity.  History reviewed. No pertinent past medical history.  There are no active problems to display for this patient.   History reviewed. No pertinent surgical history.  Allergies Patient has no known allergies.  Social History Social History   Tobacco Use  . Smoking status: Current Every Day Smoker    Packs/day: 0.50    Types: Cigarettes  . Smokeless tobacco: Never Used  Substance Use Topics  . Alcohol use: Yes    Comment: rarely  . Drug use: No   Review of Systems Constitutional: Negative for fever. Eyes: Positive for photophobia ENT: Positive for left ear pain Cardiovascular: Negative for chest pain. Respiratory: Negative for shortness of breath. Gastrointestinal: Negative for abdominal pain, vomiting and diarrhea. Musculoskeletal: Negative for back pain. Skin: Negative for rash. Neurological: Positive for headache  All systems negative/normal/unremarkable except as stated in the HPI  ____________________________________________   PHYSICAL EXAM:  VITAL SIGNS: ED Triage Vitals  Enc Vitals Group     BP 06/06/17 2005 (!) 141/96     Pulse Rate 06/06/17 2005 91     Resp 06/06/17 2005 18     Temp 06/06/17 2005 97.8 F (36.6 C)     Temp Source 06/06/17 2005 Oral     SpO2 06/06/17 2005  97 %     Weight 06/06/17 2006 230 lb (104.3 kg)     Height 06/06/17 2006 6\' 2"  (1.88 m)     Head Circumference --      Peak Flow --      Pain Score 06/06/17 2005 10     Pain Loc --      Pain Edu? --      Excl. in GC? --    Constitutional: Alert and oriented.  Tearful, mild distress Eyes: Conjunctivae are normal. Normal extraocular movements.  No significant photophobia is noted ENT   Head: Normocephalic and atraumatic.  Left TM is red and bulging   Nose: No congestion/rhinnorhea.   Mouth/Throat: Mucous membranes are moist.  Generalized poor dentition, no obvious abscess   Neck: No stridor. Cardiovascular: Normal rate, regular rhythm. No murmurs, rubs, or gallops. Respiratory: Normal respiratory effort without tachypnea nor retractions. Breath sounds are clear and equal bilaterally. No wheezes/rales/rhonchi. Musculoskeletal: Nontender with normal range of motion in extremities. No lower extremity tenderness nor edema. Neurologic:  Normal speech and language. No gross focal neurologic deficits are appreciated.  Skin:  Skin is warm, dry and intact. No rash noted. Psychiatric: Mood and affect are normal. Speech and behavior are normal.  ____________________________________________  ED COURSE:  As part of my medical decision making, I reviewed the following data within the electronic MEDICAL RECORD NUMBER History obtained from family if available, nursing notes, old chart and ekg, as well as notes from prior ED visits. Patient presented for headache, we will assess with labs and imaging as  indicated at this time.   Procedures ____________________________________________   LABS (pertinent positives/negatives)  Labs Reviewed  CBC WITH DIFFERENTIAL/PLATELET - Abnormal; Notable for the following components:      Result Value   WBC 11.3 (*)    HCT 39.6 (*)    Neutro Abs 7.9 (*)    All other components within normal limits  COMPREHENSIVE METABOLIC PANEL - Abnormal; Notable for  the following components:   Sodium 131 (*)    Chloride 95 (*)    Glucose, Bld 101 (*)    Calcium 8.6 (*)    All other components within normal limits    RADIOLOGY Images were viewed by me  CT head Is unremarkable ____________________________________________  DIFFERENTIAL DIAGNOSIS   Otitis media, migraine headache, tension headache, TMJ, toothache, abscess  FINAL ASSESSMENT AND PLAN  Otitis media, headache   Plan: The patient had presented for headache and ear pain. Patient's labs were reassuring. Patient's imaging so reassuring.  Patient had improvement with IV headache cocktail.  We did give her Rocephin to begin treating for his otitis media.  He be discharged with a Z-Pak and steroids.  He is cleared for outpatient follow-up.   Ulice DashJohnathan E Falesha Schommer, MD   Note: This note was generated in part or whole with voice recognition software. Voice recognition is usually quite accurate but there are transcription errors that can and very often do occur. I apologize for any typographical errors that were not detected and corrected.     Emily FilbertWilliams, Rabecka Brendel E, MD 06/06/17 2226

## 2017-06-06 NOTE — ED Notes (Signed)
Pt has left side headache, neck pain , left ear pain.   Sx for 1 day.  Pt states taking tylenol without relief.  Pt tearful

## 2017-10-23 IMAGING — CR DG CHEST 2V
1 series · 2 of 2 positions shown · non-contrast
Comparison: None.

CLINICAL DATA: Headache vomiting

EXAM:
CHEST  2 VIEW

[Series 1: dg chest 2 view · 0.14mm/px · 2 of 2 slices shown]
[im 1/2]
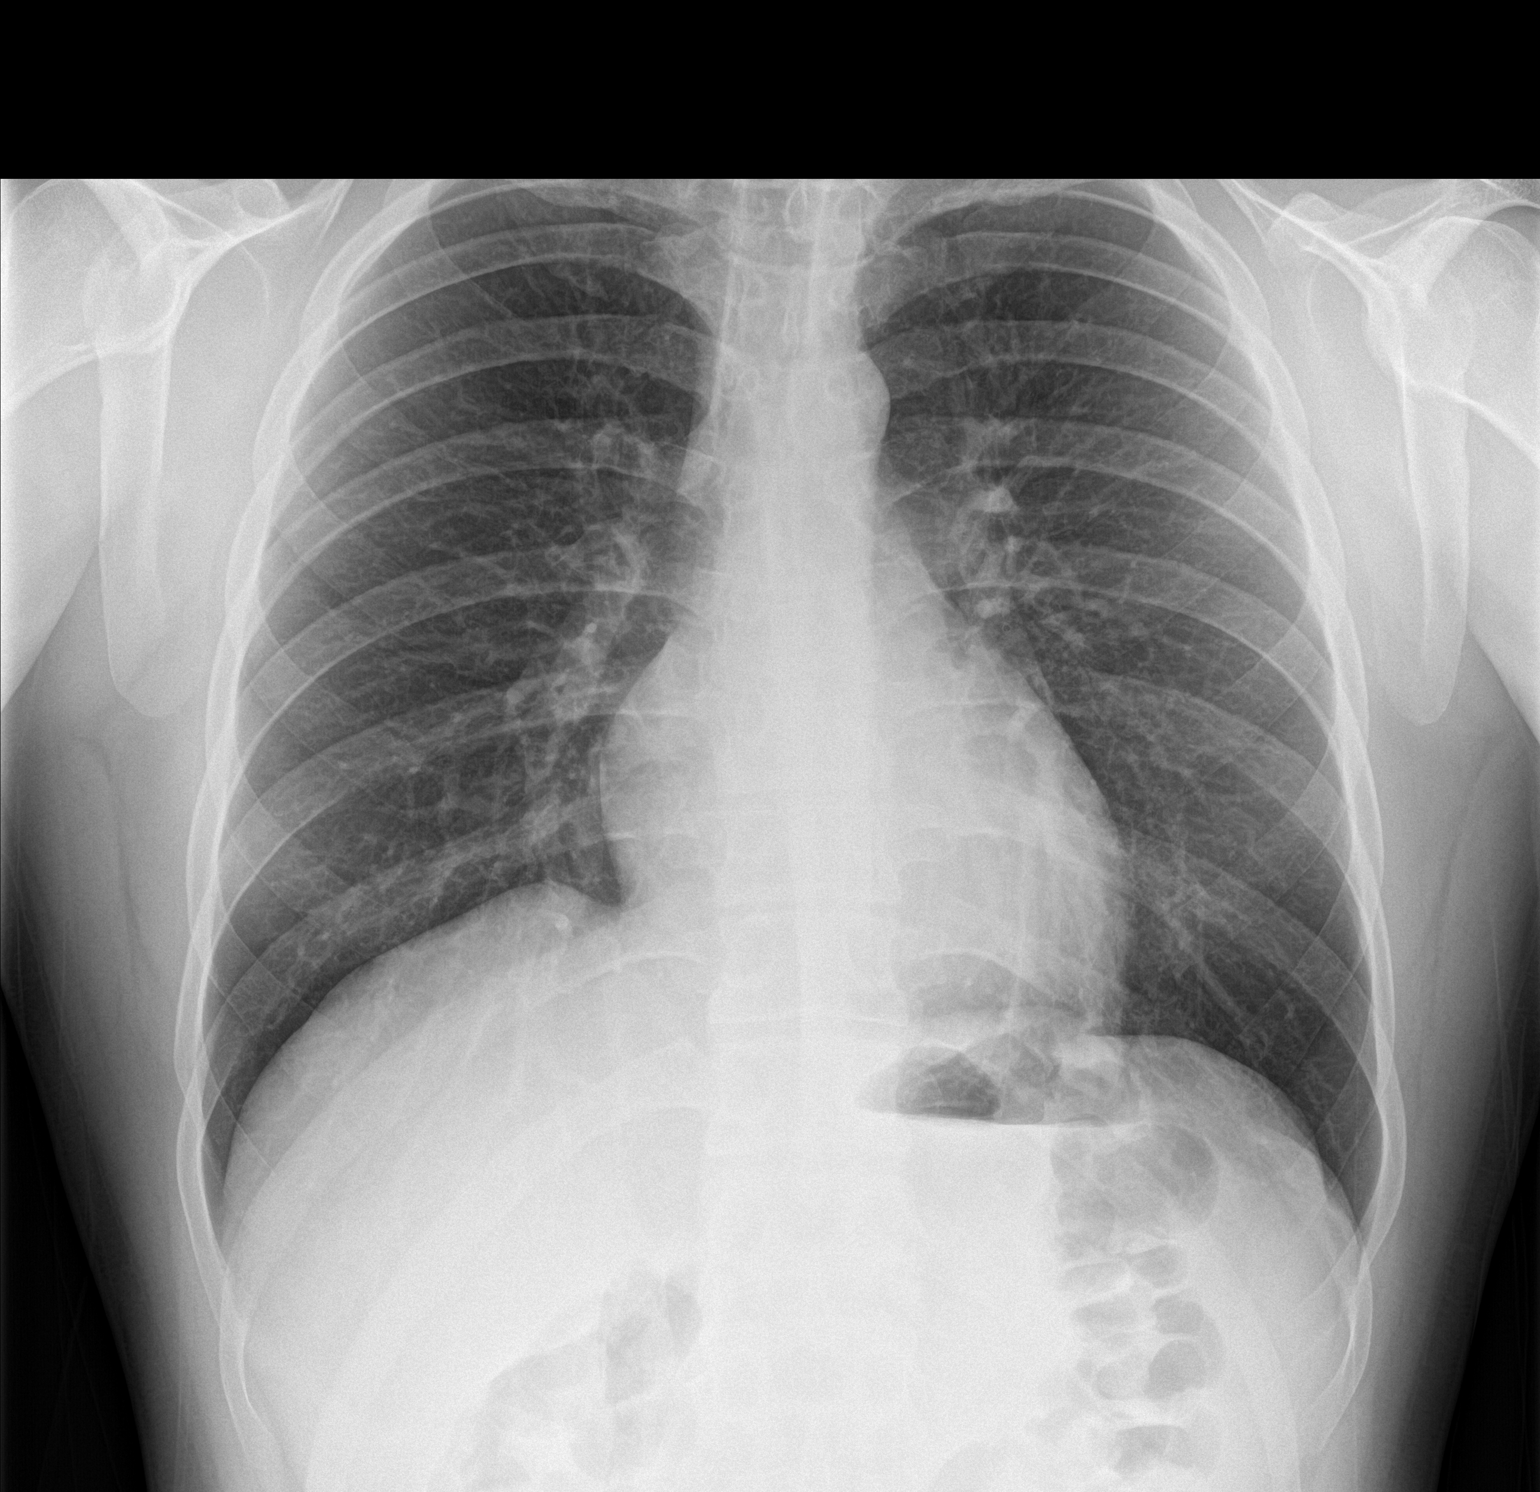
[im 2/2]
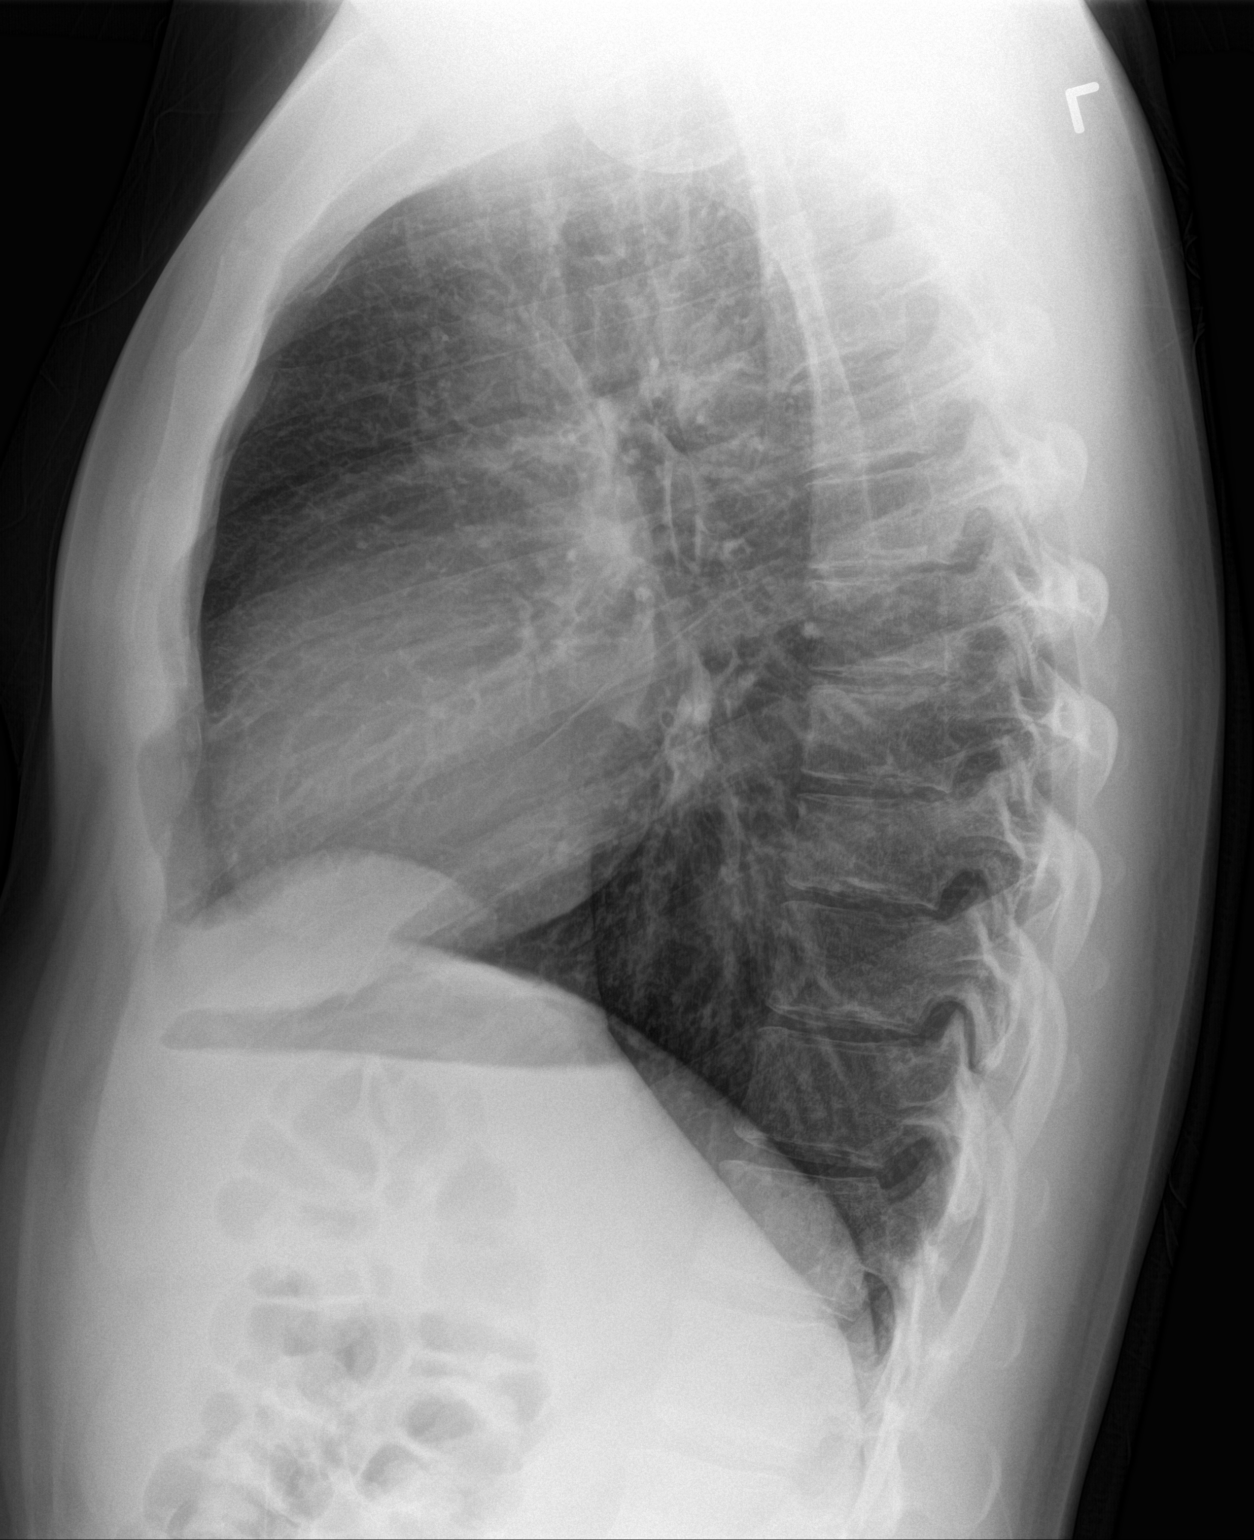

[2 of 2 positions shown; findings below may reference images not displayed]

FINDINGS: The heart size and mediastinal contours are within normal limits.
Both lungs are clear. The visualized skeletal structures are
unremarkable.
IMPRESSION: No active cardiopulmonary disease.

## 2018-12-26 IMAGING — DX DG HAND COMPLETE 3+V*L*
3 series · 3 of 3 positions shown · non-contrast
Comparison: None.

CLINICAL DATA: Injury to third through fifth digits. Evaluate for
fracture.

EXAM:
LEFT HAND - COMPLETE 3+ VIEW

[hand ap]
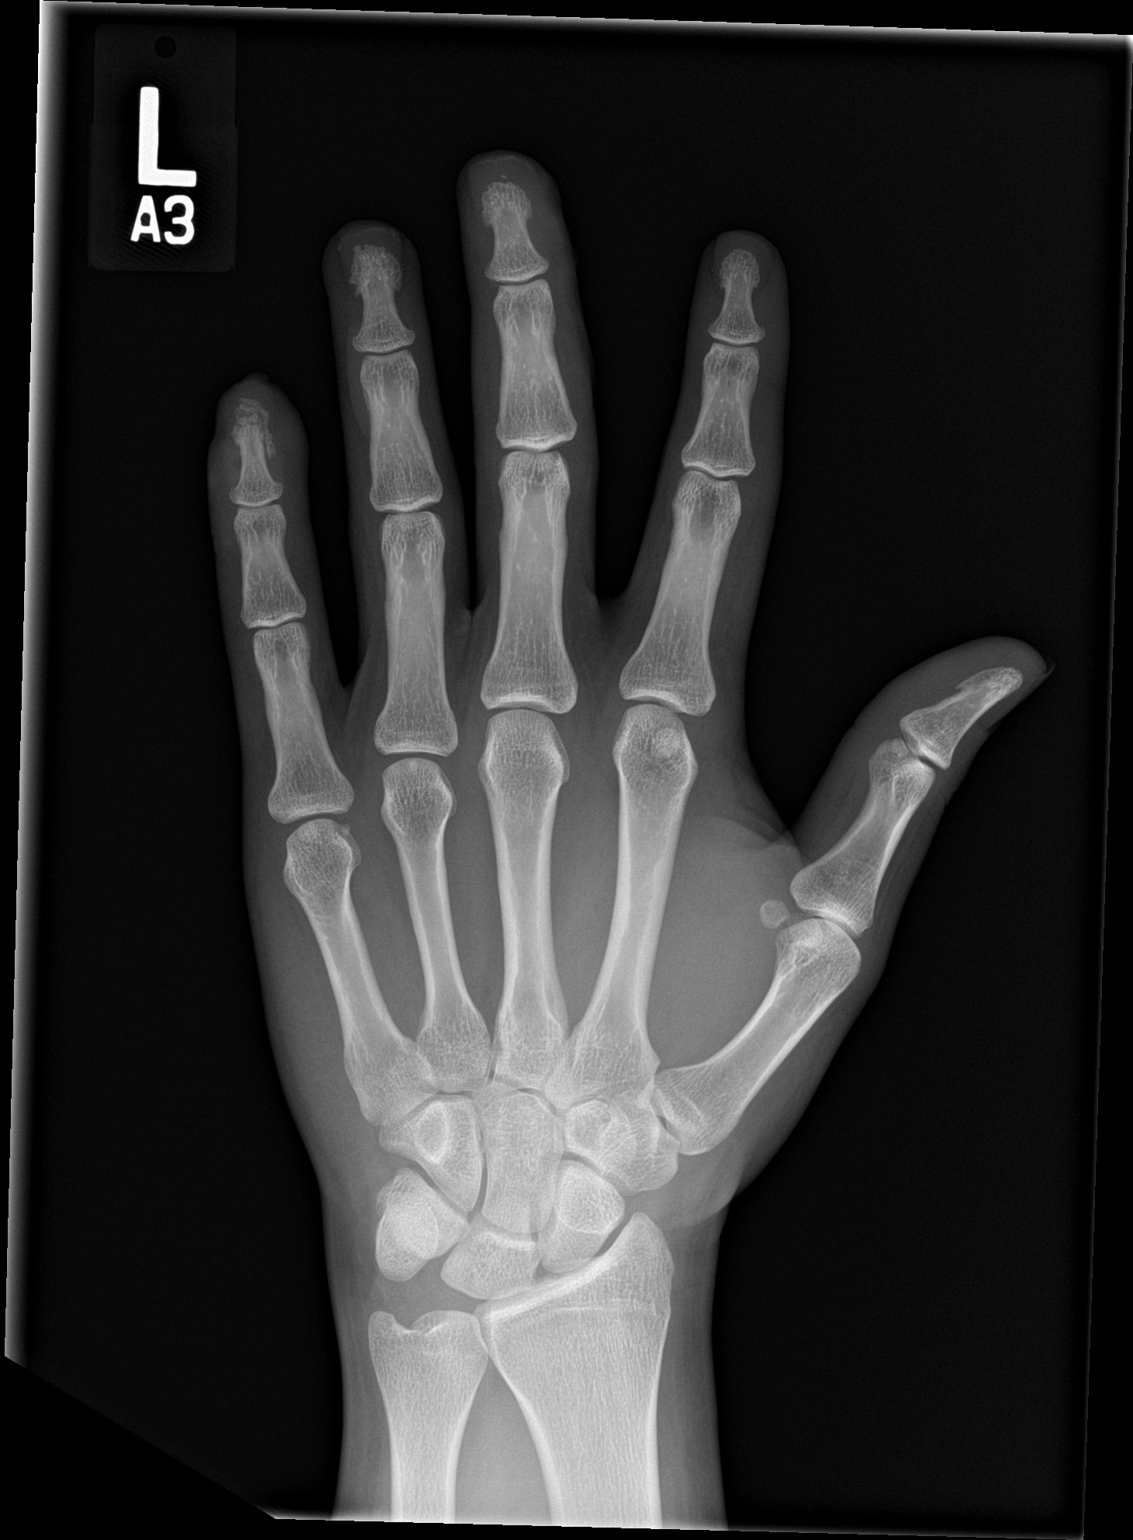

[hand obl]
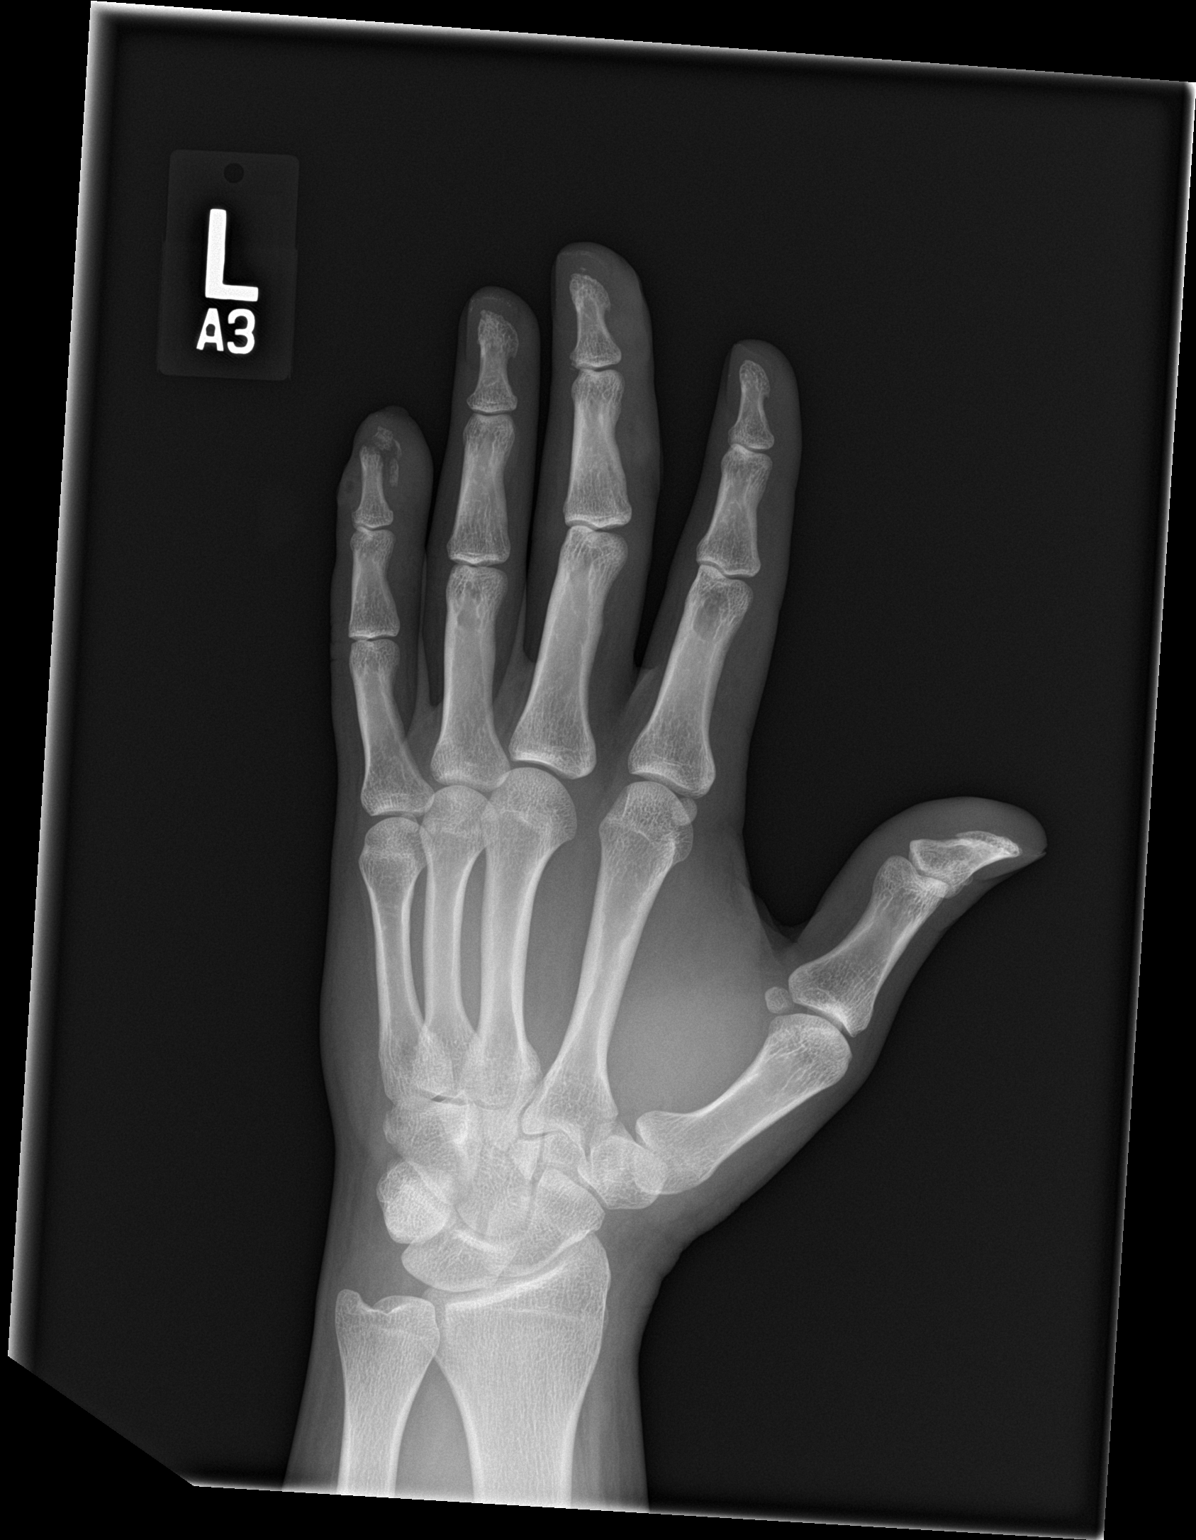

[hand lat]
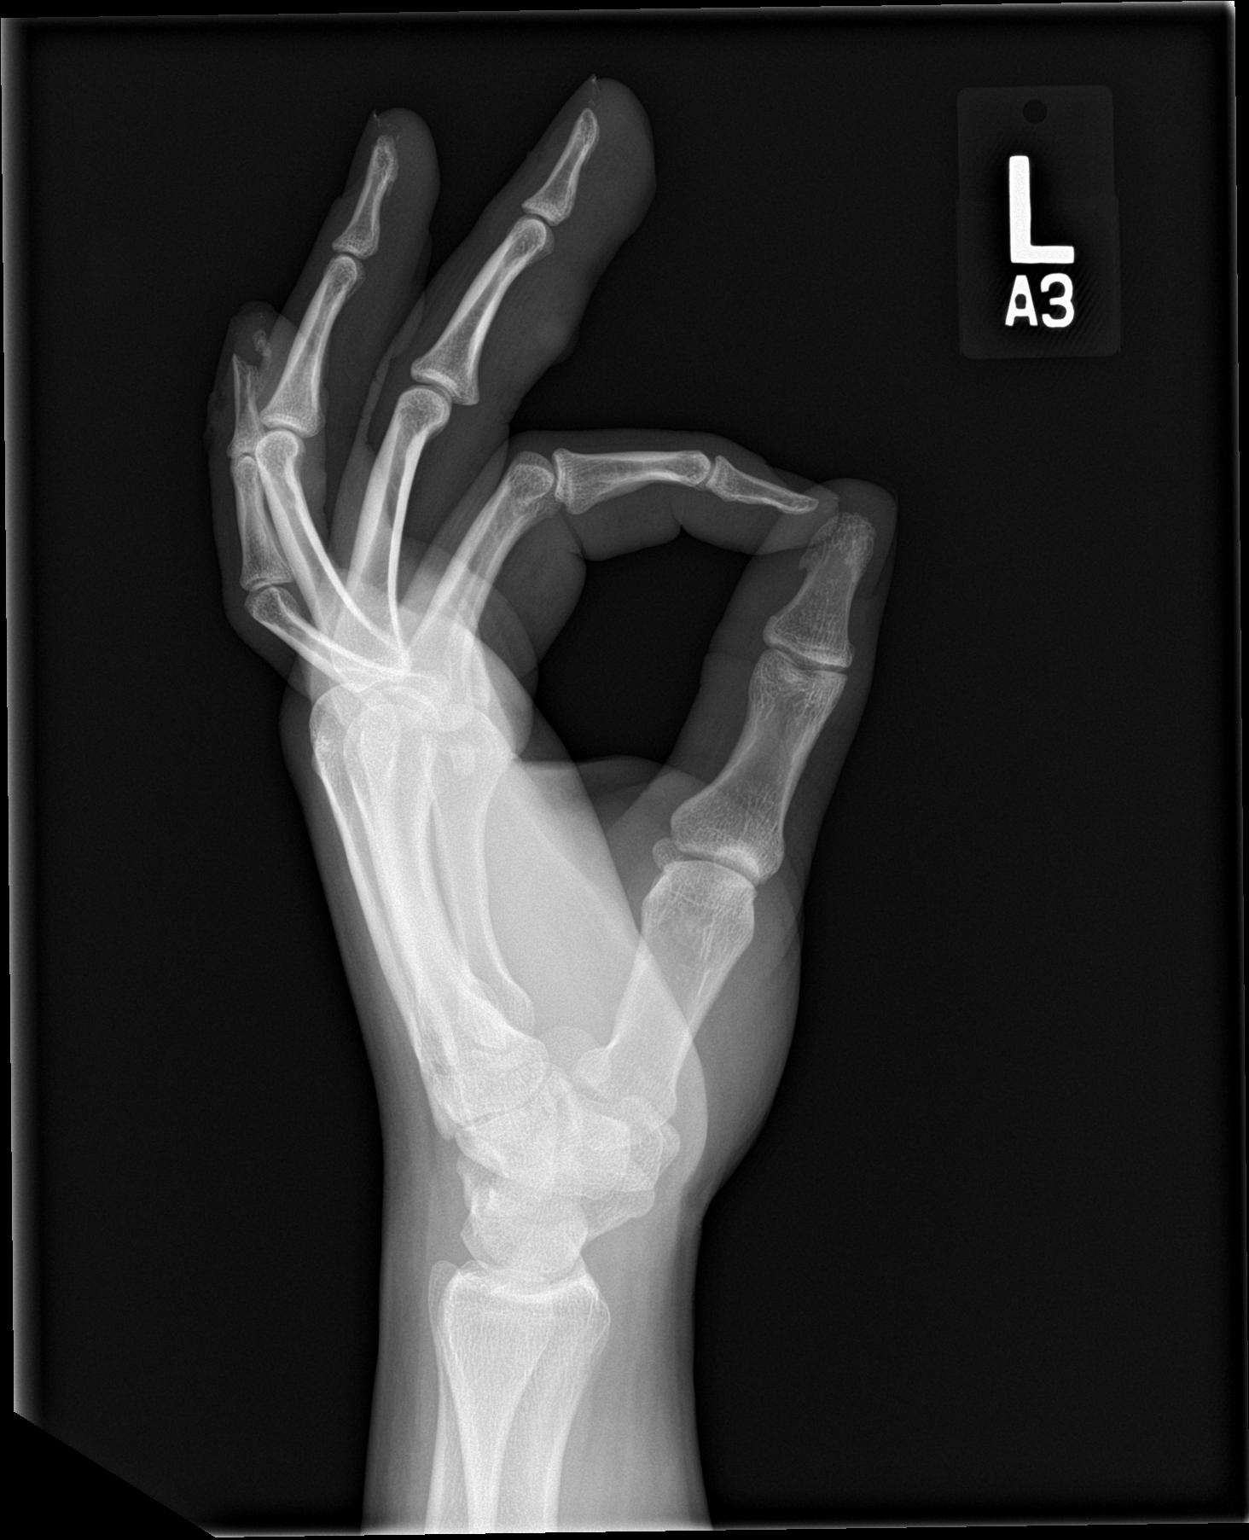

[3 of 3 positions shown; findings below may reference images not displayed]

FINDINGS: There is a comminuted fracture deformity involving the tuft of the
fifth distal phalanx. Volar and radial displacement of the distal
fracture fragments identified. Overlying soft tissue irregularity
and subcutaneous gas identified indicating laceration.
IMPRESSION: Comminuted fracture deformity involves the tuft of the fifth distal
phalanx.

## 2020-02-26 ENCOUNTER — Ambulatory Visit
Admission: EM | Admit: 2020-02-26 | Discharge: 2020-02-26 | Disposition: A | Discharge status: Home / Self Care | Payer: BC Managed Care – PPO | Attending: Family Medicine | Admitting: Family Medicine

## 2020-02-26 DIAGNOSIS — J22 Unspecified acute lower respiratory infection: Secondary | ICD-10-CM

## 2020-02-26 MED ORDER — LEVOCETIRIZINE DIHYDROCHLORIDE 5 MG PO TABS: 5.0000 mg | ORAL_TABLET | Freq: Every evening | ORAL | 0 refills | Status: DC

## 2020-02-26 MED ORDER — PSEUDOEPH-BROMPHEN-DM 30-2-10 MG/5ML PO SYRP: 5.0000 mL | ORAL_SOLUTION | Freq: Four times a day (QID) | ORAL | 0 refills | Status: DC | PRN

## 2020-02-26 NOTE — ED Triage Notes (Signed)
Pt reports having cough, swollen glands and feeling fatigue. Symptoms began yesterday.

## 2020-02-26 NOTE — ED Provider Notes (Signed)
Renaldo Fiddler    CSN: [id number] Arrival date & time: 02/26/20  1406      History   Chief Complaint Chief Complaint  Patient presents with  . Cough    HPI Jesse Kane is a 35 y.o. male.   HPI Patient presents today with sore throat, congestion, cervical adenopathy headache, fatigue, fever and cough x1 day.  Patient unaware of any known exposure to COVID-19.  No known sick contacts.  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 He is unvaccinated.  History reviewed. No pertinent past medical history.  There are no problems to display for this patient.   History reviewed. No pertinent surgical history.     Home Medications    Prior to Admission medications   Medication Sig Start Date End Date Taking? Authorizing Provider  azithromycin (ZITHROMAX Z-PAK) 250 MG tablet Take 2 tablets (500 mg) on  Day 1,  followed by 1 tablet (250 mg) once daily on Days 2 through 5. 06/06/17   Emily Filbert, MD  HYDROcodone-acetaminophen (NORCO) 5-325 MG tablet Take 1 tablet by mouth every 4 (four) hours as needed for moderate pain. 03/04/17   Willy Eddy, MD  HYDROcodone-acetaminophen (NORCO/VICODIN) 5-325 MG tablet Take 1 tablet by mouth every 4 (four) hours as needed for moderate pain. 01/09/15   Minna Antis, MD  ibuprofen (ADVIL,MOTRIN) 800 MG tablet Take 1 tablet (800 mg total) by mouth every 8 (eight) hours as needed. 06/06/17   Emily Filbert, MD  penicillin v potassium (VEETID) 500 MG tablet Take 1 tablet (500 mg total) by mouth 4 (four) times daily. 01/09/15   Minna Antis, MD  predniSONE (STERAPRED UNI-PAK 21 TAB) 10 MG (21) TBPK tablet Take by mouth daily. Dispense steroid taper pack as directed 06/06/17   Emily Filbert, MD  promethazine (PHENERGAN) 12.5 MG tablet Take 1 tablet (12.5 mg total) by mouth every 6 (six) hours as needed for nausea or vomiting. 12/31/15   Willy Eddy, MD  sulfamethoxazole-trimethoprim (BACTRIM  DS,SEPTRA DS) 800-160 MG per tablet Take 1 tablet by mouth 2 (two) times daily. 09/14/14   Schaevitz, Myra Rude, MD    Family History No family history on file.  Social History Social History   Tobacco Use  . Smoking status: Former Smoker    Packs/day: 0.50    Types: Cigarettes  . Smokeless tobacco: Never Used  Substance Use Topics  . Alcohol use: Not Currently  . Drug use: No     Allergies   Patient has no known allergies.   Review of Systems Review of Systems Pertinent negatives listed in HPI  Physical Exam Triage Vital Signs ED Triage Vitals  Enc Vitals Group     BP 02/26/20 1453 139/86     Pulse Rate 02/26/20 1453 (!) 115     Resp 02/26/20 1453 18     Temp 02/26/20 1453 99.1 F (37.3 C)     Temp Source 02/26/20 1453 Oral     SpO2 02/26/20 1453 98 %     Weight --      Height --      Head Circumference --      Peak Flow --      Pain Score 02/26/20 1512 0     Pain Loc --      Pain Edu? --      Excl. in GC? --    No data found.  Updated Vital Signs BP 139/86   Pulse (!) 115  Temp 99.1 F (37.3 C) (Oral)   Resp 18   SpO2 98%   Visual Acuity Right Eye Distance:   Left Eye Distance:   Bilateral Distance:    Right Eye Near:   Left Eye Near:    Bilateral Near:     Physical Exam  General Appearance:    Alert, cooperative, ill-appearing, no distress  HENT:   Normocephalic, ears normal, nares mucosal edema with congestion, rhinorrhea, oropharynx  Erythematous w/ +3 tonsil, uvula swelling   Eyes:    PERRL, conjunctiva/corneas clear, EOM's intact       Lungs:     Clear to auscultation bilaterally, respirations unlabored  Heart:    Regular rate and rhythm  Neurologic:   Awake, alert, oriented x 3. No apparent focal neurological           defect.     UC Treatments / Results  Labs (all labs ordered are listed, but only abnormal results are displayed) Labs Reviewed  COVID-19, FLU A+B NAA    EKG   Radiology No results  found.  Procedures Procedures (including critical care time)  Medications Ordered in UC Medications - No data to display  Initial Impression / Assessment and Plan / UC Course  I have reviewed the triage vital signs and the nursing notes.  Pertinent labs & imaging results that were available during my care of the patient were reviewed by me and considered in my medical decision making (see chart for details).    COVID/Flu test pending. Symptom management warranted only.  Manage fever with Tylenol and ibuprofen.  Nasal symptoms with over-the-counter antihistamines recommended.  Treatment per discharge medications/discharge instructions.  Red flags/ER precautions given. The most current CDC isolation/quarantine recommendation advised.  Final Clinical Impressions(s) / UC Diagnosis   Final diagnoses:  Acute respiratory infection, viral     Discharge Instructions     Your COVID 19 results will be available in 3-5 days. Negative results are immediately resulted to Mychart. Positive results will receive a follow-up call from our clinic. If symptoms are present, I recommend home quarantine until results are known.   Take medication as prescribed. Your symptom are viral, antibiotics are not warranted, as symptoms resolve with symptoms management 7-14 days.    ED Prescriptions    Medication Sig Dispense Auth. Provider   brompheniramine-pseudoephedrine-DM 30-2-10 MG/5ML syrup Take 5 mLs by mouth 4 (four) times daily as needed. 120 mL Bing Neighbors, FNP   levocetirizine (XYZAL) 5 MG tablet Take 1 tablet (5 mg total) by mouth every evening. 30 tablet Bing Neighbors, FNP     PDMP not reviewed this encounter.   Bing Neighbors, FNP 03/03/20 Ernestina [place]

## 2020-02-26 NOTE — Discharge Instructions (Signed)
Your COVID 19 results will be available in 3-5 days. Negative results are immediately resulted to Mychart. Positive results will receive a follow-up call from our clinic. If symptoms are present, I recommend home quarantine until results are known.   Take medication as prescribed. Your symptom are viral, antibiotics are not warranted, as symptoms resolve with symptoms management 7-14 days.

## 2020-02-28 LAB — COVID-19, FLU A+B NAA
Influenza A, NAA: NOT DETECTED
Influenza B, NAA: NOT DETECTED
SARS-CoV-2, NAA: DETECTED — AB

## 2020-07-01 ENCOUNTER — Ambulatory Visit
Admission: EM | Admit: 2020-07-01 | Discharge: 2020-07-01 | Disposition: A | Discharge status: Home / Self Care | Payer: BC Managed Care – PPO

## 2020-07-01 ENCOUNTER — Other Ambulatory Visit: Payer: Self-pay

## 2020-07-01 ENCOUNTER — Encounter: Payer: Self-pay | Admitting: Emergency Medicine

## 2020-07-01 DIAGNOSIS — J0110 Acute frontal sinusitis, unspecified: Secondary | ICD-10-CM

## 2020-07-01 MED ORDER — AMOXICILLIN 875 MG PO TABS: 875.0000 mg | ORAL_TABLET | Freq: Two times a day (BID) | ORAL | 0 refills | Status: AC

## 2020-07-01 NOTE — ED Provider Notes (Signed)
Jesse Kane    CSN: [id number] Arrival date & time: 07/01/20  1413      History   Chief Complaint Chief Complaint  Patient presents with  . URI    HPI Jesse Kane is a 35 y.o. male.   Patient presents with 3-week history of sinus congestion, postnasal drip, runny nose, sinus pressure.  He denies fever, chills, sore throat, cough, shortness of breath, vomiting, diarrhea, or other symptoms.  Treatment attempted with Claritin.  He denies pertinent medical history.  The history is provided by the patient.    History reviewed. No pertinent past medical history.  There are no problems to display for this patient.   History reviewed. No pertinent surgical history.     Home Medications    Prior to Admission medications   Medication Sig Start Date End Date Taking? Authorizing Provider  amoxicillin (AMOXIL) 875 MG tablet Take 1 tablet (875 mg total) by mouth 2 (two) times daily for 7 days. 07/01/20 07/08/20 Yes Mickie Bail, NP  loratadine (CLARITIN) 10 MG tablet Take 10 mg by mouth daily.   Yes [provider]  levocetirizine (XYZAL) 5 MG tablet Take 1 tablet (5 mg total) by mouth every evening. Patient not taking: Reported on 07/01/2020 02/26/20 07/01/20  Bing Neighbors, FNP  promethazine (PHENERGAN) 12.5 MG tablet Take 1 tablet (12.5 mg total) by mouth every 6 (six) hours as needed for nausea or vomiting. Patient not taking: Reported on 07/01/2020 12/31/15 07/01/20  Willy Eddy, MD    Family History Family History  Problem Relation Age of Onset  . Healthy Mother   . Healthy Father     Social History Social History   Tobacco Use  . Smoking status: Former Smoker    Packs/day: 0.50    Types: Cigarettes  . Smokeless tobacco: Never Used  Vaping Use  . Vaping Use: Never used  Substance Use Topics  . Alcohol use: Not Currently  . Drug use: No     Allergies   Patient has no known allergies.   Review of Systems Review of Systems  Constitutional:  Negative for chills and fever.  HENT: Positive for congestion, postnasal drip, rhinorrhea and sinus pressure. Negative for ear pain and sore throat.   Respiratory: Negative for cough and shortness of breath.   Cardiovascular: Negative for chest pain and palpitations.  Gastrointestinal: Negative for abdominal pain, diarrhea and vomiting.  Skin: Negative for color change and rash.  All other systems reviewed and are negative.    Physical Exam Triage Vital Signs ED Triage Vitals  Enc Vitals Group     BP      Pulse      Resp      Temp      Temp src      SpO2      Weight      Height      Head Circumference      Peak Flow      Pain Score      Pain Loc      Pain Edu?      Excl. in GC?    No data found.  Updated Vital Signs BP 126/78 (BP Location: Left Arm)   Pulse 92   Temp 98.2 F (36.8 C) (Oral)   Resp 20   SpO2 98%   Visual Acuity Right Eye Distance:   Left Eye Distance:   Bilateral Distance:    Right Eye Near:   Left Eye Near:  Bilateral Near:     Physical Exam Vitals and nursing note reviewed.  Constitutional:      General: He is not in acute distress.    Appearance: He is well-developed. He is not ill-appearing.  HENT:     Head: Normocephalic and atraumatic.     Right Ear: Tympanic membrane normal.     Left Ear: Tympanic membrane normal.     Nose: Congestion present.     Mouth/Throat:     Mouth: Mucous membranes are moist.     Pharynx: Oropharynx is clear.  Eyes:     Conjunctiva/sclera: Conjunctivae normal.  Cardiovascular:     Rate and Rhythm: Normal rate and regular rhythm.     Heart sounds: Normal heart sounds.  Pulmonary:     Effort: Pulmonary effort is normal. No respiratory distress.     Breath sounds: Normal breath sounds.  Abdominal:     Palpations: Abdomen is soft.     Tenderness: There is no abdominal tenderness.  Musculoskeletal:     Cervical back: Neck supple.  Skin:    General: Skin is warm and dry.  Neurological:     General:  No focal deficit present.     Mental Status: He is alert and oriented to person, place, and time.     Gait: Gait normal.  Psychiatric:        Mood and Affect: Mood normal.        Behavior: Behavior normal.      UC Treatments / Results  Labs (all labs ordered are listed, but only abnormal results are displayed) Labs Reviewed - No data to display  EKG   Radiology No results found.  Procedures Procedures (including critical care time)  Medications Ordered in UC Medications - No data to display  Initial Impression / Assessment and Plan / UC Course  I have reviewed the triage vital signs and the nursing notes.  Pertinent labs & imaging results that were available during my care of the patient were reviewed by me and considered in my medical decision making (see chart for details).   Acute frontal sinusitis.  Treating with amoxicillin.  Discussed other symptomatic treatment including ibuprofen and plain over-the-counter Mucinex.  Instructed patient to follow-up with his PCP if his symptoms are not improving.  He agrees to plan of care.   Final Clinical Impressions(s) / UC Diagnoses   Final diagnoses:  Acute non-recurrent frontal sinusitis     Discharge Instructions     Take the amoxicillin as directed.  Take ibuprofen as needed for fever or discomfort.  Take plain over-the-counter Mucinex as needed for congestion.    Follow up with your primary care provider if your symptoms are not improving.        ED Prescriptions    Medication Sig Dispense Auth. Provider   amoxicillin (AMOXIL) 875 MG tablet Take 1 tablet (875 mg total) by mouth 2 (two) times daily for 7 days. 14 tablet Mickie Bail, NP     PDMP not reviewed this encounter.   Mickie Bail, NP 07/01/20 1453

## 2020-07-01 NOTE — ED Triage Notes (Addendum)
Nasal congestion and drainage, puffy eyes.  Patient has phlegm he is coughing up, clear.  Symptoms for 2-3 weeks.

## 2020-07-01 NOTE — Discharge Instructions (Addendum)
Take the amoxicillin as directed.  Take ibuprofen as needed for fever or discomfort.  Take plain over-the-counter Mucinex as needed for congestion.    Follow up with your primary care provider if your symptoms are not improving.

## 2020-07-17 ENCOUNTER — Ambulatory Visit
Admission: EM | Admit: 2020-07-17 | Discharge: 2020-07-17 | Disposition: A | Discharge status: Home / Self Care | Payer: BC Managed Care – PPO | Attending: Emergency Medicine | Admitting: Emergency Medicine

## 2020-07-17 DIAGNOSIS — Z1152 Encounter for screening for COVID-19: Secondary | ICD-10-CM

## 2020-07-17 DIAGNOSIS — J069 Acute upper respiratory infection, unspecified: Secondary | ICD-10-CM

## 2020-07-17 NOTE — Discharge Instructions (Addendum)
Your COVID test is pending.  You should self quarantine until the test result is back.    Take Tylenol or ibuprofen as needed for fever or discomfort.  Rest and keep yourself hydrated.    Follow-up with your primary care provider if your symptoms are not improving.     

## 2020-07-17 NOTE — ED Provider Notes (Signed)
Renaldo Fiddler    CSN: [id number] Arrival date & time: 07/17/20  1114      History   Chief Complaint Chief Complaint  Patient presents with  . Nasal Congestion    HPI Jesse Kane is a 35 y.o. male.   Patient presents with 1 day history of nasal congestion and cough.  He states his glands feel swollen in his neck.  He denies fever, chills, earache, sore throat, shortness of breath, vomiting, diarrhea, or other symptoms.  OTC treatment attempted at home.  Patient was seen here on 07/01/2020; diagnosed with acute sinusitis; treated with amoxicillin.  He states this medication completely relieved his symptoms.   The history is provided by the patient and medical records.    History reviewed. No pertinent past medical history.  There are no problems to display for this patient.   History reviewed. No pertinent surgical history.     Home Medications    Prior to Admission medications   Medication Sig Start Date End Date Taking? Authorizing Provider  loratadine (CLARITIN) 10 MG tablet Take 10 mg by mouth daily.    [provider]  levocetirizine (XYZAL) 5 MG tablet Take 1 tablet (5 mg total) by mouth every evening. Patient not taking: Reported on 07/01/2020 02/26/20 07/01/20  Bing Neighbors, FNP  promethazine (PHENERGAN) 12.5 MG tablet Take 1 tablet (12.5 mg total) by mouth every 6 (six) hours as needed for nausea or vomiting. Patient not taking: Reported on 07/01/2020 12/31/15 07/01/20  Willy Eddy, MD    Family History Family History  Problem Relation Age of Onset  . Healthy Mother   . Healthy Father     Social History Social History   Tobacco Use  . Smoking status: Former Smoker    Packs/day: 0.50    Types: Cigarettes  . Smokeless tobacco: Never Used  Vaping Use  . Vaping Use: Never used  Substance Use Topics  . Alcohol use: Not Currently  . Drug use: No     Allergies   Patient has no known allergies.   Review of Systems Review of  Systems  Constitutional: Negative for chills and fever.  HENT: Positive for congestion. Negative for ear pain and sore throat.   Respiratory: Positive for cough. Negative for shortness of breath.   Cardiovascular: Negative for chest pain and palpitations.  Gastrointestinal: Negative for abdominal pain, diarrhea and vomiting.  Skin: Negative for color change and rash.  All other systems reviewed and are negative.    Physical Exam Triage Vital Signs ED Triage Vitals  Enc Vitals Group     BP      Pulse      Resp      Temp      Temp src      SpO2      Weight      Height      Head Circumference      Peak Flow      Pain Score      Pain Loc      Pain Edu?      Excl. in GC?    No data found.  Updated Vital Signs BP 117/68   Pulse 99   Temp 98.9 F (37.2 C) (Oral)   Resp 16   Ht 6' 2.5" (1.892 m)   Wt 240 lb (108.9 kg)   SpO2 99%   BMI 30.40 kg/m   Visual Acuity Right Eye Distance:   Left Eye Distance:   Bilateral Distance:  Right Eye Near:   Left Eye Near:    Bilateral Near:     Physical Exam Vitals and nursing note reviewed.  Constitutional:      General: He is not in acute distress.    Appearance: He is well-developed.  HENT:     Head: Normocephalic and atraumatic.     Right Ear: Tympanic membrane normal.     Left Ear: Tympanic membrane normal.     Nose: Nose normal.     Mouth/Throat:     Mouth: Mucous membranes are moist.     Pharynx: Oropharynx is clear.  Eyes:     Conjunctiva/sclera: Conjunctivae normal.  Cardiovascular:     Rate and Rhythm: Normal rate and regular rhythm.     Heart sounds: Normal heart sounds.  Pulmonary:     Effort: Pulmonary effort is normal. No respiratory distress.     Breath sounds: Normal breath sounds.  Abdominal:     Palpations: Abdomen is soft.     Tenderness: There is no abdominal tenderness.  Musculoskeletal:     Cervical back: Neck supple.  Skin:    General: Skin is warm and dry.  Neurological:     General:  No focal deficit present.     Mental Status: He is alert and oriented to person, place, and time.  Psychiatric:        Mood and Affect: Mood normal.        Behavior: Behavior normal.      UC Treatments / Results  Labs (all labs ordered are listed, but only abnormal results are displayed) Labs Reviewed  NOVEL CORONAVIRUS, NAA    EKG   Radiology No results found.  Procedures Procedures (including critical care time)  Medications Ordered in UC Medications - No data to display  Initial Impression / Assessment and Plan / UC Course  I have reviewed the triage vital signs and the nursing notes.  Pertinent labs & imaging results that were available during my care of the patient were reviewed by me and considered in my medical decision making (see chart for details).   Viral URI.  COVID pending.  Instructed patient to self quarantine until the test result is back.  Discussed symptomatic treatment including Tylenol or ibuprofen, Mucinex, rest, hydration.  Instructed patient to follow up with PCP if his symptoms are not improving.  Patient agrees to plan of care.    Final Clinical Impressions(s) / UC Diagnoses   Final diagnoses:  Encounter for screening for COVID-19  Viral URI     Discharge Instructions     Your COVID test is pending.  You should self quarantine until the test result is back.    Take Tylenol or ibuprofen as needed for fever or discomfort.  Rest and keep yourself hydrated.    Follow-up with your primary care provider if your symptoms are not improving.        ED Prescriptions    None     PDMP not reviewed this encounter.   Mickie Bail, NP 07/17/20 1145

## 2020-07-17 NOTE — ED Triage Notes (Signed)
Pt reports having nasal congestion and swollen glands that began yesterday. Coughing up clear mucous.

## 2020-07-18 LAB — SARS-COV-2, NAA 2 DAY TAT

## 2020-07-18 LAB — NOVEL CORONAVIRUS, NAA: SARS-CoV-2, NAA: NOT DETECTED

## 2020-08-09 ENCOUNTER — Other Ambulatory Visit: Payer: Self-pay | Admitting: Family Medicine

## 2021-04-15 ENCOUNTER — Ambulatory Visit
Admission: RE | Admit: 2021-04-15 | Discharge: 2021-04-15 | Disposition: A | Discharge status: Home / Self Care | Payer: BC Managed Care – PPO | Source: Ambulatory Visit | Attending: Emergency Medicine | Admitting: Emergency Medicine

## 2021-04-15 ENCOUNTER — Other Ambulatory Visit: Payer: Self-pay

## 2021-04-15 VITALS — BP 125/84 | HR 100 | Temp 98.4°F | Resp 18

## 2021-04-15 DIAGNOSIS — H1033 Unspecified acute conjunctivitis, bilateral: Secondary | ICD-10-CM

## 2021-04-15 MED ORDER — POLYMYXIN B-TRIMETHOPRIM 10000-0.1 UNIT/ML-% OP SOLN: 1.0000 [drp] | Freq: Four times a day (QID) | OPHTHALMIC | 0 refills | Status: AC

## 2021-04-15 NOTE — ED Triage Notes (Signed)
Pt here with bilateral eye drainage, redness, and photosensitivity x 3 days. Pt entire household has had pink eye recently.

## 2021-04-15 NOTE — ED Provider Notes (Signed)
Jesse Kane    CSN: [id number] Arrival date & time: 04/15/21  1608      History   Chief Complaint Chief Complaint  Patient presents with   Eye Problem    HPI Jesse Kane is a 36 y.o. male.  Patient presents with 3-day history of eye redness, itching, thick yellow mucus drainage.  His symptoms started in his right eye and now in both.  No acute eye pain or changes in vision.  No eye injury.  No fever, sore throat, cough, shortness of breath, or other symptoms.  Several family members have had pinkeye; patient has been using a family members antibiotic eyedrops.  The history is provided by the patient.   History reviewed. No pertinent past medical history.  There are no problems to display for this patient.   History reviewed. No pertinent surgical history.     Home Medications    Prior to Admission medications   Medication Sig Start Date End Date Taking? Authorizing Provider  trimethoprim-polymyxin b (POLYTRIM) ophthalmic solution Place 1 drop into both eyes 4 (four) times daily for 7 days. 04/15/21 04/22/21 Yes Sharion Balloon, NP  loratadine (CLARITIN) 10 MG tablet Take 10 mg by mouth daily.    [provider]  levocetirizine (XYZAL) 5 MG tablet Take 1 tablet (5 mg total) by mouth every evening. Patient not taking: Reported on 07/01/2020 02/26/20 07/01/20  Scot Jun, FNP  promethazine (PHENERGAN) 12.5 MG tablet Take 1 tablet (12.5 mg total) by mouth every 6 (six) hours as needed for nausea or vomiting. Patient not taking: Reported on 07/01/2020 12/31/15 07/01/20  Merlyn Lot, MD    Family History Family History  Problem Relation Age of Onset   Healthy Mother    Healthy Father     Social History Social History   Tobacco Use   Smoking status: Former    Packs/day: 0.50    Types: Cigarettes   Smokeless tobacco: Never  Vaping Use   Vaping Use: Never used  Substance Use Topics   Alcohol use: Not Currently   Drug use: No     Allergies    Patient has no known allergies.   Review of Systems Review of Systems  Constitutional:  Negative for chills and fever.  HENT:  Negative for ear pain and sore throat.   Eyes:  Positive for discharge, redness and itching. Negative for pain and visual disturbance.  Respiratory:  Negative for cough and shortness of breath.   Cardiovascular:  Negative for chest pain and palpitations.  Gastrointestinal:  Negative for diarrhea and vomiting.  Skin:  Negative for color change and rash.  All other systems reviewed and are negative.   Physical Exam Triage Vital Signs ED Triage Vitals  Enc Vitals Group     BP 04/15/21 1620 125/84     Pulse Rate 04/15/21 1620 100     Resp 04/15/21 1620 18     Temp 04/15/21 1620 98.4 F (36.9 C)     Temp src --      SpO2 04/15/21 1620 97 %     Weight --      Height --      Head Circumference --      Peak Flow --      Pain Score 04/15/21 1627 4     Pain Loc --      Pain Edu? --      Excl. in [place]? --    No data found.  Updated Vital Signs BP  125/84    Pulse 100    Temp 98.4 F (36.9 C)    Resp 18    SpO2 97%   Visual Acuity Right Eye Distance:   Left Eye Distance:   Bilateral Distance:    Right Eye Near:   Left Eye Near:    Bilateral Near:     Physical Exam Vitals and nursing note reviewed.  Constitutional:      General: He is not in acute distress.    Appearance: He is well-developed.  HENT:     Mouth/Throat:     Mouth: Mucous membranes are moist.  Eyes:     General: Lids are normal. Vision grossly intact.        Right eye: Discharge present.        Left eye: Discharge present.    Extraocular Movements: Extraocular movements intact.     Conjunctiva/sclera:     Right eye: Right conjunctiva is injected.     Left eye: Left conjunctiva is injected.     Pupils: Pupils are equal, round, and reactive to light.     Comments: Thick yellow-white mucous in lashes and inner canthus, L>R.   Cardiovascular:     Rate and Rhythm: Normal rate  and regular rhythm.     Heart sounds: Normal heart sounds.  Pulmonary:     Effort: Pulmonary effort is normal. No respiratory distress.     Breath sounds: Normal breath sounds.  Musculoskeletal:     Cervical back: Neck supple.  Skin:    General: Skin is warm and dry.  Neurological:     Mental Status: He is alert.  Psychiatric:        Mood and Affect: Mood normal.        Behavior: Behavior normal.     UC Treatments / Results  Labs (all labs ordered are listed, but only abnormal results are displayed) Labs Reviewed - No data to display  EKG   Radiology No results found.  Procedures Procedures (including critical care time)  Medications Ordered in UC Medications - No data to display  Initial Impression / Assessment and Plan / UC Course  I have reviewed the triage vital signs and the nursing notes.  Pertinent labs & imaging results that were available during my care of the patient were reviewed by me and considered in my medical decision making (see chart for details).  Bacterial conjunctivitis of both eyes.  Treating with Polytrim eyedrops.  Instructed patient to follow-up with his eye care provider if his symptoms are not improving.  ED precautions discussed.  Education provided on bacterial conjunctivitis.  Patient agrees to plan of care.   Final Clinical Impressions(s) / UC Diagnoses   Final diagnoses:  Acute bacterial conjunctivitis of both eyes     Discharge Instructions      Use the antibiotic eyedrops as prescribed.    Follow-up with your eye doctor for a recheck in 1 to 2 days if your symptoms are not improving.    Go to the emergency department if you have acute eye pain, changes in your vision, or other concerning symptoms.        ED Prescriptions     Medication Sig Dispense Auth. Provider   trimethoprim-polymyxin b (POLYTRIM) ophthalmic solution Place 1 drop into both eyes 4 (four) times daily for 7 days. 10 mL Sharion Balloon, NP      PDMP  not reviewed this encounter.   Sharion Balloon, NP 04/15/21 [phone]

## 2021-04-15 NOTE — Discharge Instructions (Addendum)
Use the antibiotic eyedrops as prescribed.    Follow-up with your eye doctor for a recheck in 1 to 2 days if your symptoms are not improving.    Go to the emergency department if you have acute eye pain, changes in your vision, or other concerning symptoms.    

## 2021-04-30 ENCOUNTER — Encounter: Payer: Self-pay | Admitting: Emergency Medicine

## 2021-04-30 ENCOUNTER — Ambulatory Visit
Admission: EM | Admit: 2021-04-30 | Discharge: 2021-04-30 | Disposition: A | Discharge status: Home / Self Care | Payer: BC Managed Care – PPO | Attending: Internal Medicine | Admitting: Internal Medicine

## 2021-04-30 ENCOUNTER — Other Ambulatory Visit: Payer: Self-pay

## 2021-04-30 DIAGNOSIS — J028 Acute pharyngitis due to other specified organisms: Secondary | ICD-10-CM | POA: Diagnosis not present

## 2021-04-30 DIAGNOSIS — B9789 Other viral agents as the cause of diseases classified elsewhere: Secondary | ICD-10-CM

## 2021-04-30 LAB — POCT RAPID STREP A (OFFICE): Rapid Strep A Screen: NEGATIVE

## 2021-04-30 NOTE — ED Provider Notes (Signed)
?[facility] ? ? ? ?CSN: [id number] ?Arrival date & time: 04/30/21  1624 ? ? ?  ? ?History   ?Chief Complaint ?Chief Complaint  ?Patient presents with  ? Sore Throat  ? ? ?HPI ?Jesse Kane is a 36 y.o. male comes to the urgent care with a 2-day history of scratchy throat and some pain on swallowing.  The entire household has been diagnosed with and treated for strep throat.  Patient denies any body aches, abdominal pain or fever.  No shortness of breath or wheezing.  No cough or sputum production..  ? ?HPI ? ?History reviewed. No pertinent past medical history. ? ?There are no problems to display for this patient. ? ? ?History reviewed. No pertinent surgical history. ? ? ? ? ?Home Medications   ? ?Prior to Admission medications   ?Medication Sig Start Date End Date Taking? Authorizing Provider  ?loratadine (CLARITIN) 10 MG tablet Take 10 mg by mouth daily.    [provider]  ?levocetirizine (XYZAL) 5 MG tablet Take 1 tablet (5 mg total) by mouth every evening. ?Patient not taking: Reported on 07/01/2020 02/26/20 07/01/20  Bing Neighbors, FNP  ?promethazine (PHENERGAN) 12.5 MG tablet Take 1 tablet (12.5 mg total) by mouth every 6 (six) hours as needed for nausea or vomiting. ?Patient not taking: Reported on 07/01/2020 12/31/15 07/01/20  Willy Eddy, MD  ? ? ?Family History ?Family History  ?Problem Relation Age of Onset  ? Healthy Mother   ? Healthy Father   ? ? ?Social History ?Social History  ? ?Tobacco Use  ? Smoking status: Former  ?  Packs/day: 0.50  ?  Types: Cigarettes  ? Smokeless tobacco: Never  ?Vaping Use  ? Vaping Use: Never used  ?Substance Use Topics  ? Alcohol use: Not Currently  ? Drug use: No  ? ? ? ?Allergies   ?Patient has no known allergies. ? ? ?Review of Systems ?Review of Systems ?As per HPI ? ?Physical Exam ?Triage Vital Signs ?ED Triage Vitals  ?Enc Vitals Group  ?   BP 04/30/21 1643 137/84  ?   Pulse Rate 04/30/21 1643 96  ?   Resp 04/30/21 1643 18  ?   Temp 04/30/21 1643  98.2 ?F (36.8 ?C)  ?   Temp Source 04/30/21 1643 Oral  ?   SpO2 04/30/21 1643 98 %  ?   Weight --   ?   Height --   ?   Head Circumference --   ?   Peak Flow --   ?   Pain Score 04/30/21 1635 5  ?   Pain Loc --   ?   Pain Edu? --   ?   Excl. in [place]? --   ? ?No data found. ? ?Updated Vital Signs ?BP 137/84 (BP Location: Left Arm)   Pulse 96   Temp 98.2 ?F (36.8 ?C) (Oral)   Resp 18   SpO2 98%  ? ?Visual Acuity ?Right Eye Distance:   ?Left Eye Distance:   ?Bilateral Distance:   ? ?Right Eye Near:   ?Left Eye Near:    ?Bilateral Near:    ? ?Physical Exam ?Vitals and nursing note reviewed.  ?Constitutional:   ?   General: He is not in acute distress. ?   Appearance: He is not ill-appearing.  ?HENT:  ?   Right Ear: Tympanic membrane normal.  ?   Left Ear: Tympanic membrane normal.  ?   Mouth/Throat:  ?   Mouth: Mucous membranes  are moist. Mucous membranes are pale.  ?   Pharynx: Posterior oropharyngeal erythema present.  ?   Tonsils: No tonsillar exudate.  ?Cardiovascular:  ?   Rate and Rhythm: Normal rate and regular rhythm.  ?   Heart sounds: Normal heart sounds.  ?Pulmonary:  ?   Effort: Pulmonary effort is normal.  ?   Breath sounds: Normal breath sounds.  ?Neurological:  ?   Mental Status: He is alert.  ? ? ? ?UC Treatments / Results  ?Labs ?(all labs ordered are listed, but only abnormal results are displayed) ?Labs Reviewed  ?POCT RAPID STREP A (OFFICE) - Normal  ?CULTURE, GROUP A STREP [facility])  ? ? ?EKG ? ? ?Radiology ?No results found. ? ?Procedures ?Procedures (including critical care time) ? ?Medications Ordered in UC ?Medications - No data to display ? ?Initial Impression / Assessment and Plan / UC Course  ?I have reviewed the triage vital signs and the nursing notes. ? ?Pertinent labs & imaging results that were available during my care of the patient were reviewed by me and considered in my medical decision making (see chart for details). ? ?  ? ?1.  Sore throat: ?Warm salt water gargle ?Tylenol/Motrin as  needed for pain ?Point-of-care strep is negative ?Return to urgent care if symptoms worsen ?Throat culture has been sent ?We will call you with recommendations if labs are abnormal. ?Final Clinical Impressions(s) / UC Diagnoses  ? ?Final diagnoses:  ?Sore throat (viral)  ? ? ? ?Discharge Instructions   ? ?  ?Warm salt water gargle ?We will call you with recommendations if labs are abnormal ?Return to urgent care if you have any other concerns. ? ? ? ?ED Prescriptions   ?None ?  ? ?PDMP not reviewed this encounter. ?  ?Merrilee Jansky, MD ?04/30/21 1701 ? ?

## 2021-04-30 NOTE — Discharge Instructions (Addendum)
Warm salt water gargle ?We will call you with recommendations if labs are abnormal ?Return to urgent care if you have any other concerns. ?

## 2021-04-30 NOTE — ED Triage Notes (Signed)
Pt here with sore throat x 2 days. Entire household has strep throat.  ?

## 2021-05-03 LAB — CULTURE, GROUP A STREP (THRC)

## 2021-05-05 ENCOUNTER — Telehealth (HOSPITAL_COMMUNITY): Payer: Self-pay | Admitting: Emergency Medicine

## 2021-05-05 MED ORDER — AZITHROMYCIN 250 MG PO TABS: 250.0000 mg | ORAL_TABLET | Freq: Every day | ORAL | 0 refills | Status: DC

## 2021-07-24 ENCOUNTER — Encounter: Payer: Self-pay | Admitting: Nurse Practitioner

## 2021-07-24 ENCOUNTER — Ambulatory Visit: Payer: BC Managed Care – PPO | Admitting: Nurse Practitioner

## 2021-07-24 ENCOUNTER — Other Ambulatory Visit: Payer: Self-pay

## 2021-07-24 VITALS — BP 120/72 | HR 98 | Temp 98.2°F | Resp 18 | Ht 74.5 in | Wt 249.3 lb

## 2021-07-24 DIAGNOSIS — Z131 Encounter for screening for diabetes mellitus: Secondary | ICD-10-CM

## 2021-07-24 DIAGNOSIS — Z130 Encounter for screening for diseases of the blood and blood-forming organs and certain disorders involving the immune mechanism: Secondary | ICD-10-CM

## 2021-07-24 DIAGNOSIS — Z0000 Encounter for general adult medical examination without abnormal findings: Secondary | ICD-10-CM | POA: Diagnosis not present

## 2021-07-24 DIAGNOSIS — Z7689 Persons encountering health services in other specified circumstances: Secondary | ICD-10-CM

## 2021-07-24 DIAGNOSIS — Z13220 Encounter for screening for lipoid disorders: Secondary | ICD-10-CM

## 2021-07-24 DIAGNOSIS — Z114 Encounter for screening for human immunodeficiency virus [HIV]: Secondary | ICD-10-CM

## 2021-07-24 DIAGNOSIS — Z1159 Encounter for screening for other viral diseases: Secondary | ICD-10-CM

## 2021-07-24 LAB — CBC WITH DIFFERENTIAL/PLATELET
Absolute Monocytes: 714 cells/uL (ref 200–950)
Eosinophils Absolute: 91 cells/uL (ref 15–500)
Neutrophils Relative %: 55.2 %

## 2021-07-24 NOTE — Progress Notes (Signed)
Name: Jesse Kane   MRN: [id number]    DOB: Nov 03, 1985   Date:07/24/2021       Progress Note  Subjective  Chief Complaint  Chief Complaint  Patient presents with   Establish Care    HPI  Patient presents for annual CPE and to establish care.  Establish care: last physical was many years ago. But everything has been normal.   Pt has no medical problems, or on any medications, no surgeries.   Diet: Well balanced diet, he says he does not eat as many vegetables as he should.  Exercise: gym membership goes 3-4 times a week, couching sports  Sleep:7 hours a night   Depression: phq 9 is negative    07/24/2021    2:53 PM  Depression screen PHQ 2/9  Decreased Interest 0  Down, Depressed, Hopeless 0  PHQ - 2 Score 0    Hypertension:  BP Readings from Last 3 Encounters:  07/24/21 120/72  04/30/21 137/84  04/15/21 125/84    Obesity: Wt Readings from Last 3 Encounters:  07/24/21 249 lb 4.8 oz (113.1 kg)  07/17/20 240 lb (108.9 kg)  06/06/17 230 lb (104.3 kg)   BMI Readings from Last 3 Encounters:  07/24/21 31.58 kg/m  07/17/20 30.40 kg/m  06/06/17 29.53 kg/m     Lipids:  No results found for: CHOL No results found for: HDL No results found for: LDLCALC No results found for: TRIG No results found for: CHOLHDL No results found for: LDLDIRECT Glucose:  Glucose, Bld  Date Value Ref Range Status  06/06/2017 101 (H) 65 - 99 mg/dL Final  62/83/1517 79 65 - 99 mg/dL Final  61/60/7371 99 65 - 99 mg/dL Final    Flowsheet Row [facility] 07/24/[facility]  AUDIT-C Score 0       Married STD testing and prevention (HIV/chl/gon/syphilis): ordered Hep C: ordered  Skin cancer: Discussed monitoring for atypical lesions Colorectal cancer: no family history, no concerns,does not qualify Prostate cancer: no concerns, no family history, does not qualify No results found for: PSA   Lung cancer:   Low Dose CT Chest recommended if Age  15-80 years, 30 pack-year currently smoking OR have quit w/in 15years. Patient does not qualify.   AAA:  The USPSTF recommends one-time screening with ultrasonography in men ages 57 to 44 years who have ever smoked ECG:  06/02/2016  Vaccines:  HPV: up to at age 51 , ask insurance if age between 34-45  Shingrix: 72-64 yo and ask insurance if covered when patient above 51 yo Pneumonia:  educated and discussed with patient. Flu:  educated and discussed with patient.  Advanced Care Planning: A voluntary discussion about advance care planning including the explanation and discussion of advance directives.  Discussed health care proxy and Living will, and the patient was able to identify a health care proxy as wife.  Patient does not have a living will at present time. If patient does have living will, I have requested they bring this to the clinic to be scanned in to their chart.  There are no problems to display for this patient.   No past surgical history on file.  Family History  Problem Relation Age of Onset   Healthy Mother    Thyroid nodules Mother    Healthy Father     Social History   Socioeconomic History   Marital status: Married    Spouse name: Lurena Joiner   Number of children: 2   Years  of education: Not on file   Highest education level: Not on file  Occupational History   Not on file  Tobacco Use   Smoking status: Former    Packs/day: 0.50    Types: Cigarettes    Quit date: 12/2018    Years since quitting: 2.5   Smokeless tobacco: Never  Vaping Use   Vaping Use: Never used  Substance and Sexual Activity   Alcohol use: Not Currently   Drug use: No   Sexual activity: Yes  Other Topics Concern   Not on file  Social History Narrative   Not on file   Social Determinants of Health   Financial Resource Strain: Low Risk    Difficulty of Paying Living Expenses: Not hard at all  Food Insecurity: No Food Insecurity   Worried About Programme researcher, broadcasting/film/videounning Out of Food in the Last  Year: Never true   Ran Out of Food in the Last Year: Never true  Transportation Needs: No Transportation Needs   Lack of Transportation (Medical): No   Lack of Transportation (Non-Medical): No  Physical Activity: Sufficiently Active   Days of Exercise per Week: 5 days   Minutes of Exercise per Session: 60 min  Stress: No Stress Concern Present   Feeling of Stress : Only a little  Social Connections: Press photographerocially Integrated   Frequency of Communication with Friends and Family: More than three times a week   Frequency of Social Gatherings with Friends and Family: More than three times a week   Attends Religious Services: More than 4 times per year   Active Member of [employer] Financial[employer]ubs or Organizations: Yes   Attends Engineer, structuralClub or Organization Meetings: More than 4 times per year   Marital Status: Married  Catering managerntimate Partner Violence: Not At Risk   Fear of Current or Ex-Partner: No   Emotionally Abused: No   Physically Abused: No   Sexually Abused: No     Current Outpatient Medications:    azithromycin (ZITHROMAX) 250 MG tablet, Take 1 tablet (250 mg total) by mouth daily. Take first 2 tablets together, then 1 every day until finished. (Patient not taking: Reported on 07/24/2021), Disp: 6 tablet, Rfl: 0   loratadine (CLARITIN) 10 MG tablet, Take 10 mg by mouth daily. (Patient not taking: Reported on 07/24/2021), Disp: , Rfl:   No Known Allergies   ROS  Constitutional: Negative for fever or weight change.  Respiratory: Negative for cough and shortness of breath.   Cardiovascular: Negative for chest pain or palpitations.  Gastrointestinal: Negative for abdominal pain, no bowel changes.  Musculoskeletal: Negative for gait problem or joint swelling.  Skin: Negative for rash.  Neurological: Negative for dizziness or headache.  No other specific complaints in a complete review of systems (except as listed in HPI above).    Objective  Vitals:   07/24/21 1450  BP: 120/72  Pulse: 98  Resp: 18  Temp: 98.2  F (36.8 C)  TempSrc: Oral  SpO2: 98%  Weight: 249 lb 4.8 oz (113.1 kg)  Height: 6' 2.5" (1.892 m)    Body mass index is 31.58 kg/m.  Physical Exam  Constitutional: Patient appears well-developed and well-nourished. No distress.  HENT: Head: Normocephalic and atraumatic. Ears: B TMs ok, no erythema or effusion; Nose: Nose normal. Mouth/Throat: Oropharynx is clear and moist. No oropharyngeal exudate.  Eyes: Conjunctivae and EOM are normal. Pupils are equal, round, and reactive to light. No scleral icterus.  Neck: Normal range of motion. Neck supple. No JVD present. No thyromegaly present.  Cardiovascular: Normal rate, regular rhythm and normal heart sounds.  No murmur heard. No BLE edema. Pulmonary/Chest: Effort normal and breath sounds normal. No respiratory distress. Abdominal: Soft. Bowel sounds are normal, no distension. There is no tenderness. no masses Musculoskeletal: Normal range of motion, no joint effusions. No gross deformities Neurological: he is alert and oriented to person, place, and time. No cranial nerve deficit. Coordination, balance, strength, speech and gait are normal.  Skin: Skin is warm and dry. No rash noted. No erythema.  Psychiatric: Patient has a normal mood and affect. behavior is normal. Judgment and thought content normal.   Recent Results (from the past 2160 hour(s))  POCT rapid strep A     Status: Normal   Collection Time: 04/30/21  4:48 PM  Result Value Ref Range   Rapid Strep A Screen Negative Negative  Culture, group A strep     Status: None   Collection Time: 04/30/21  5:00 PM   Specimen: Throat  Result Value Ref Range   Specimen Description THROAT    Special Requests      NONE Performed at [facility], [address], [place], [place] [address]    Culture MODERATE STREPTOCOCCUS,BETA HEMOLYTIC NOT GROUP A    Report Status 05/03/2021 FINAL      Fall Risk:    07/24/2021    2:52 PM  Fall Risk   Falls in the past year? 0  Number  falls in past yr: 0  Injury with Fall? 0  Follow up Falls evaluation completed     Functional Status Survey: Is the patient deaf or have difficulty hearing?: No Does the patient have difficulty seeing, even when wearing glasses/contacts?: No Does the patient have difficulty concentrating, remembering, or making decisions?: No Does the patient have difficulty walking or climbing stairs?: No Does the patient have difficulty dressing or bathing?: No Does the patient have difficulty doing errands alone such as visiting a doctor's office or shopping?: No    Assessment & Plan  1. Annual physical exam  - Lipid panel - CBC with Differential/Platelet - COMPLETE METABOLIC PANEL WITH GFR - Hemoglobin A1c - Hepatitis C antibody - HIV Antibody (routine testing w rflx)  2. Encounter to establish care  - Lipid panel - CBC with Differential/Platelet - COMPLETE METABOLIC PANEL WITH GFR - Hemoglobin A1c - Hepatitis C antibody - HIV Antibody (routine testing w rflx)  3. Screening for diabetes mellitus  - COMPLETE METABOLIC PANEL WITH GFR - Hemoglobin A1c  4. Screening for cholesterol level  - Lipid panel  5. Encounter for hepatitis C screening test for low risk patient  - Hepatitis C antibody  6. Screening for HIV without presence of risk factors  - HIV Antibody (routine testing w rflx)  7. Screening for deficiency anemia  - CBC with Differential/Platelet    -Prostate cancer screening and PSA options (with potential risks and benefits of testing vs not testing) were discussed along with recent recs/guidelines. -USPSTF grade A and B recommendations reviewed with patient; age-appropriate recommendations, preventive care, screening tests, etc discussed and encouraged; healthy living encouraged; see AVS for patient education given to patient -Discussed importance of 150 minutes of physical activity weekly, eat two servings of fish weekly, eat one serving of tree nuts ( cashews,  pistachios, pecans, almonds.Marland Kitchen) every other day, eat 6 servings of fruit/vegetables daily and drink plenty of water and avoid sweet beverages.

## 2021-07-25 LAB — COMPLETE METABOLIC PANEL WITH GFR
AG Ratio: 1.7 (calc) (ref 1.0–2.5)
ALT: 44 U/L (ref 9–46)
AST: 34 U/L (ref 10–40)
Albumin: 4.3 g/dL (ref 3.6–5.1)
Alkaline phosphatase (APISO): 84 U/L (ref 36–130)
BUN: 13 mg/dL (ref 7–25)
CO2: 25 mmol/L (ref 20–32)
Calcium: 9.3 mg/dL (ref 8.6–10.3)
Chloride: 105 mmol/L (ref 98–110)
Creat: 0.98 mg/dL (ref 0.60–1.26)
Globulin: 2.5 g/dL (calc) (ref 1.9–3.7)
Glucose, Bld: 87 mg/dL (ref 65–99)
Potassium: 3.9 mmol/L (ref 3.5–5.3)
Sodium: 140 mmol/L (ref 135–146)
Total Bilirubin: 0.9 mg/dL (ref 0.2–1.2)
Total Protein: 6.8 g/dL (ref 6.1–8.1)
eGFR: 104 mL/min/{1.73_m2} (ref >=60)

## 2021-07-25 LAB — LIPID PANEL
Cholesterol: 199 mg/dL (ref <200)
HDL: 30 mg/dL — ABNORMAL LOW (ref >=40)
LDL Cholesterol (Calc): 126 mg/dL (calc) — ABNORMAL HIGH
Non-HDL Cholesterol (Calc): 169 mg/dL (calc) — ABNORMAL HIGH (ref <130)
Total CHOL/HDL Ratio: 6.6 (calc) — ABNORMAL HIGH (ref <5.0)
Triglycerides: 298 mg/dL — ABNORMAL HIGH (ref <150)

## 2021-07-25 LAB — HIV ANTIBODY (ROUTINE TESTING W REFLEX): HIV 1&2 Ab, 4th Generation: NONREACTIVE

## 2021-07-25 LAB — CBC WITH DIFFERENTIAL/PLATELET
Basophils Absolute: 77 cells/uL (ref 0–200)
Basophils Relative: 1.1 %
Eosinophils Relative: 1.3 %
HCT: 42.1 % (ref 38.5–50.0)
Hemoglobin: 14.2 g/dL (ref 13.2–17.1)
Lymphs Abs: 2254 cells/uL (ref 850–3900)
MCH: 28.1 pg (ref 27.0–33.0)
MCHC: 33.7 g/dL (ref 32.0–36.0)
MCV: 83.4 fL (ref 80.0–100.0)
MPV: 9.4 fL (ref 7.5–12.5)
Monocytes Relative: 10.2 %
Neutro Abs: 3864 cells/uL (ref 1500–7800)
Platelets: 222 10*3/uL (ref 140–400)
RBC: 5.05 10*6/uL (ref 4.20–5.80)
RDW: 12.9 % (ref 11.0–15.0)
Total Lymphocyte: 32.2 %
WBC: 7 10*3/uL (ref 3.8–10.8)

## 2021-07-25 LAB — HEMOGLOBIN A1C
Hgb A1c MFr Bld: 5.3 % of total Hgb (ref <5.7)
Mean Plasma Glucose: 105 mg/dL
eAG (mmol/L): 5.8 mmol/L

## 2021-07-25 LAB — HEPATITIS C ANTIBODY
Hepatitis C Ab: NONREACTIVE
SIGNAL TO CUT-OFF: 0.12 (ref <1.00)

## 2021-07-28 ENCOUNTER — Telehealth: Payer: Self-pay | Admitting: Nurse Practitioner

## 2021-07-28 NOTE — Telephone Encounter (Unsigned)
Copied from CRM [id number]. Topic: General - Other >> Jul 28, 2021 10:37 AM Eliseo Gum, Deedra Ehrich wrote: Reason for CRM:pt returned call, not sure what it was about

## 2021-07-28 NOTE — Telephone Encounter (Signed)
Patient notified of labs.   

## 2021-12-24 ENCOUNTER — Ambulatory Visit (INDEPENDENT_AMBULATORY_CARE_PROVIDER_SITE_OTHER): Payer: BC Managed Care – PPO | Admitting: Family Medicine

## 2021-12-24 ENCOUNTER — Encounter: Payer: Self-pay | Admitting: Family Medicine

## 2021-12-24 VITALS — BP 132/78 | HR 95 | Temp 97.9°F | Resp 16 | Ht 74.5 in | Wt 258.1 lb

## 2021-12-24 DIAGNOSIS — J069 Acute upper respiratory infection, unspecified: Secondary | ICD-10-CM

## 2021-12-24 DIAGNOSIS — R591 Generalized enlarged lymph nodes: Secondary | ICD-10-CM

## 2021-12-24 NOTE — Patient Instructions (Signed)
Can use antihistamines, steroid or saline nasal sprays, tylenol/ibuprofen for aches/pain/fever, delsym/robitussin and/or mucinex for cough if it worsens. Likely a viral illness. Recommend you do not go to work if you worsen with fever And isolate if you have a positive home COVID test.   Upper Respiratory Infection, Adult An upper respiratory infection (URI) affects the nose, throat, and upper airways that lead to the lungs. The most common type of URI is often called the common cold. URIs usually get better on their own, without medical treatment. What are the causes? A URI is caused by a germ (virus). You may catch these germs by: Breathing in droplets from an infected person's cough or sneeze. Touching something that has the germ on it (is contaminated) and then touching your mouth, nose, or eyes. What increases the risk? You are more likely to get a URI if: You are very young or very old. You have close contact with others, such as at work, school, or a health care facility. You smoke. You have long-term (chronic) heart or lung disease. You have a weakened disease-fighting system (immune system). You have nasal allergies or asthma. You have a lot of stress. You have poor nutrition. What are the signs or symptoms? Runny or stuffy (congested) nose. Cough. Sneezing. Sore throat. Headache. Feeling tired (fatigue). Fever. Not wanting to eat as much as usual. Pain in your forehead, behind your eyes, and over your cheekbones (sinus pain). Muscle aches. Redness or irritation of the eyes. Pressure in the ears or face. How is this treated? URIs usually get better on their own within 7-10 days. Medicines cannot cure URIs, but your doctor may recommend certain medicines to help relieve symptoms, such as: Over-the-counter cold medicines. Medicines to reduce coughing (cough suppressants). Coughing is a type of defense against infection that helps to clear the nose, throat, windpipe, and  lungs (respiratory system). Take these medicines only as told by your doctor. Medicines to lower your fever. Follow these instructions at home: Activity Rest as needed. If you have a fever, stay home from work or school until your fever is gone, or until your doctor says you may return to work or school. You should stay home until you cannot spread the infection anymore (you are not contagious). Your doctor may have you wear a face mask so you have less risk of spreading the infection. Relieving symptoms Rinse your mouth often with salt water. To make salt water, dissolve -1 tsp (3-6 g) of salt in 1 cup (237 mL) of warm water. Use a cool-mist humidifier to add moisture to the air. This can help you breathe more easily. Eating and drinking  Drink enough fluid to keep your pee (urine) pale yellow. Eat soups and other clear broths. General instructions  Take over-the-counter and prescription medicines only as told by your doctor. Do not smoke or use any products that contain nicotine or tobacco. If you need help quitting, ask your doctor. Avoid being where people are smoking (avoid secondhand smoke). Stay up to date on all your shots (immunizations), and get the flu shot every year. Keep all follow-up visits. How to prevent the spread of infection to others  Wash your hands with soap and water for at least 20 seconds. If you cannot use soap and water, use hand sanitizer. Avoid touching your mouth, face, eyes, or nose. Cough or sneeze into a tissue or your sleeve or elbow. Do not cough or sneeze into your hand or into the air. Contact a doctor  if: You are getting worse, not better. You have any of these: A fever or chills. Brown or red mucus in your nose. Yellow or brown fluid (discharge)coming from your nose. Pain in your face, especially when you bend forward. Swollen neck glands. Pain when you swallow. White areas in the back of your throat. Get help right away if: You have  shortness of breath that gets worse. You have very bad or constant: Headache. Ear pain. Pain in your forehead, behind your eyes, and over your cheekbones (sinus pain). Chest pain. You have long-lasting (chronic) lung disease along with any of these: Making high-pitched whistling sounds when you breathe, most often when you breathe out (wheezing). Long-lasting cough (more than 14 days). Coughing up blood. A change in your usual mucus. You have a stiff neck. You have changes in your: Vision. Hearing. Thinking. Mood. These symptoms may be an emergency. Get help right away. Call 911. Do not wait to see if the symptoms will go away. Do not drive yourself to the hospital. Summary An upper respiratory infection (URI) is caused by a germ (virus). The most common type of URI is often called the common cold. URIs usually get better within 7-10 days. Take over-the-counter and prescription medicines only as told by your doctor. This information is not intended to replace advice given to you by your health care provider. Make sure you discuss any questions you have with your health care provider. Document Revised: 09/11/2020 Document Reviewed: 09/11/2020 Elsevier Patient Education  2023 [employer].

## 2021-12-24 NOTE — Progress Notes (Signed)
Patient ID: Jesse Kane, male    DOB: [birth year], 36 y.o.   MRN: [id number]  PCP: Bo Merino, FNP  Chief Complaint  Patient presents with   Edema    Lymph nodes mainly left side   Cough    Slight cough over the weekend    Subjective:   Jesse Kane is a 36 y.o. male, presents to clinic with CC of the following:  HPI    Slight cough mild URI sx and swollen lymph nodes and decreased energy onset about 2-3 d ago At first he thought it was weather/allergies cause [place]. He has hx of being COVID positive with initial sx of swollen sore lymph nodes.  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 Hilario Kane test was positive      There are no problems to display for this patient.    No current outpatient medications on file.   No Known Allergies   Social History   Tobacco Use   Smoking status: Former    Packs/day: 0.50    Types: Cigarettes    Quit date: 12/2018    Years since quitting: 3.0   Smokeless tobacco: Never  Vaping Use   Vaping Use: Never used  Substance Use Topics   Alcohol use: Not Currently   Drug use: No      Chart Review Today: I personally reviewed active problem list, medication list, allergies, family history, social history, health maintenance, notes from last encounter, lab results, imaging with the patient/caregiver today.   Review of Systems  Constitutional: Negative.   HENT: Negative.    Eyes: Negative.   Respiratory: Negative.    Cardiovascular: Negative.   Gastrointestinal: Negative.   Endocrine: Negative.   Genitourinary: Negative.   Musculoskeletal: Negative.   Skin: Negative.   Allergic/Immunologic: Negative.   Neurological: Negative.   Hematological: Negative.   Psychiatric/Behavioral: Negative.    All other systems reviewed and  are negative.      Objective:   Vitals:   12/24/21 1510  BP: 132/78  Pulse: 95  Resp: 16  Temp: 97.9 F (36.6 C)  TempSrc: Oral  SpO2: 98%  Weight: 258 lb 1.6 oz (117.1 kg)  Height: 6' 2.5" (1.892 m)    Body mass index is 32.69 kg/m.  Physical Exam Vitals and nursing note reviewed.  Constitutional:      General: He is not in acute distress.    Appearance: Normal appearance. He is well-developed. He is not ill-appearing, toxic-appearing or diaphoretic.  HENT:     Head: Normocephalic and atraumatic.     Jaw: No trismus.     Right Ear: Tympanic membrane, ear canal and external ear normal.     Left Ear: Tympanic membrane, ear canal and external ear normal.     Nose: Mucosal edema, congestion and rhinorrhea present. Rhinorrhea is clear.     Right Sinus: No maxillary sinus tenderness or frontal sinus tenderness.     Left Sinus: No maxillary sinus tenderness or frontal sinus tenderness.     Mouth/Throat:     Mouth: Mucous membranes are not pale, not dry and not cyanotic.     Pharynx: Oropharynx is clear. Uvula midline. Posterior oropharyngeal erythema present. No oropharyngeal exudate or uvula swelling.     Tonsils: No tonsillar exudate  or tonsillar abscesses.  Eyes:     General: Lids are normal. No scleral icterus.       Right eye: No discharge.        Left eye: No discharge.     Conjunctiva/sclera: Conjunctivae normal.  Neck:     Trachea: Trachea and phonation normal. No tracheal deviation.  Cardiovascular:     Rate and Rhythm: Normal rate and regular rhythm.     Pulses: Normal pulses.          Radial pulses are 2+ on the right side and 2+ on the left side.     Heart sounds: Normal heart sounds. No murmur heard.    No friction rub. No gallop.  Pulmonary:     Effort: Pulmonary effort is normal. No tachypnea, accessory muscle usage or respiratory distress.     Breath sounds: Normal breath sounds. No stridor. No decreased breath sounds, wheezing, rhonchi or rales.   Abdominal:     General: Bowel sounds are normal. There is no distension.     Palpations: Abdomen is soft.     Tenderness: There is no abdominal tenderness.  Musculoskeletal:        General: Normal range of motion.     Cervical back: Normal range of motion and neck supple.  Lymphadenopathy:     Head:     Right side of head: No submental, submandibular or tonsillar adenopathy.     Left side of head: No submental, submandibular or tonsillar adenopathy.     Cervical: No cervical adenopathy.  Skin:    General: Skin is warm and dry.     Capillary Refill: Capillary refill takes less than 2 seconds.     Coloration: Skin is not pale.     Findings: No rash.     Nails: There is no clubbing.  Neurological:     Mental Status: He is alert and oriented to person, place, and time.     Motor: No abnormal muscle tone.     Coordination: Coordination normal.     Gait: Gait normal.  Psychiatric:        Speech: Speech normal.        Behavior: Behavior normal. Behavior is cooperative.      Results for orders placed or performed in visit on 07/24/21  Lipid panel  Result Value Ref Range   Cholesterol 199 <200 mg/dL   HDL 30 (L) > OR = 40 mg/dL   Triglycerides 298 (H) <150 mg/dL   LDL Cholesterol (Calc) 126 (H) mg/dL (calc)   Total CHOL/HDL Ratio 6.6 (H) <5.0 (calc)   Non-HDL Cholesterol (Calc) 169 (H) <130 mg/dL (calc)  CBC with Differential/Platelet  Result Value Ref Range   WBC 7.0 3.8 - 10.8 Thousand/uL   RBC 5.05 4.20 - 5.80 Million/uL   Hemoglobin 14.2 13.2 - 17.1 g/dL   HCT 42.1 38.5 - 50.0 %   MCV 83.4 80.0 - 100.0 fL   MCH 28.1 27.0 - 33.0 pg   MCHC 33.7 32.0 - 36.0 g/dL   RDW 12.9 11.0 - 15.0 %   Platelets 222 140 - 400 Thousand/uL   MPV 9.4 7.5 - 12.5 fL   Neutro Abs 3,864 1,500 - 7,800 cells/uL   Lymphs Abs 2,254 850 - 3,900 cells/uL   Absolute Monocytes 714 200 - 950 cells/uL   Eosinophils Absolute 91 15 - 500 cells/uL   Basophils Absolute 77 0 - 200 cells/uL   Neutrophils  Relative % 55.2 %   Total Lymphocyte 32.2 %  Monocytes Relative 10.2 %   Eosinophils Relative 1.3 %   Basophils Relative 1.1 %  COMPLETE METABOLIC PANEL WITH GFR  Result Value Ref Range   Glucose, Bld 87 65 - 99 mg/dL   BUN 13 7 - 25 mg/dL   Creat 0.98 0.60 - 1.26 mg/dL   eGFR 104 > OR = 60 mL/min/1.96m   BUN/Creatinine Ratio NOT APPLICABLE 6 - 22 (calc)   Sodium 140 135 - 146 mmol/L   Potassium 3.9 3.5 - 5.3 mmol/L   Chloride 105 98 - 110 mmol/L   CO2 25 20 - 32 mmol/L   Calcium 9.3 8.6 - 10.3 mg/dL   Total Protein 6.8 6.1 - 8.1 g/dL   Albumin 4.3 3.6 - 5.1 g/dL   Globulin 2.5 1.9 - 3.7 g/dL (calc)   AG Ratio 1.7 1.0 - 2.5 (calc)   Total Bilirubin 0.9 0.2 - 1.2 mg/dL   Alkaline phosphatase (APISO) 84 36 - 130 U/L   AST 34 10 - 40 U/L   ALT 44 9 - 46 U/L  Hemoglobin A1c  Result Value Ref Range   Hgb A1c MFr Bld 5.3 <5.7 % of total Hgb   Mean Plasma Glucose 105 mg/dL   eAG (mmol/L) 5.8 mmol/L  Hepatitis C antibody  Result Value Ref Range   Hepatitis C Ab NON-REACTIVE NON-REACTIVE   SIGNAL TO CUT-OFF 0.12 <1.00  HIV Antibody (routine testing w rflx)  Result Value Ref Range   HIV 1&2 Ab, 4th Generation NON-REACTIVE NON-REACTIVE       Assessment & Plan:     ICD-10-CM   1. Upper respiratory tract infection, unspecified type  J06.9 Novel Coronavirus, NAA ([facility])    2. Lymphadenopathy  R59.1 Novel Coronavirus, NAA ([facility])      Diffuse nasal and posteroid oropharygeal erythema, no palpable lymphadenopathy, and lungs clear, VSS Will test for covid, offered work note for pt to stay home until test results.  He will test at home as well.  Today would be day 3 if covid positive, discussed and offered paxlovid, but he declined if positive. Supportive and sx tx also discussed and put on AVS     LDelsa Grana PA-C 12/24/21 3:22 PM

## 2021-12-25 LAB — NOVEL CORONAVIRUS, NAA: SARS-CoV-2, NAA: NOT DETECTED

## 2021-12-25 LAB — SPECIMEN STATUS REPORT

## 2022-04-13 ENCOUNTER — Telehealth (INDEPENDENT_AMBULATORY_CARE_PROVIDER_SITE_OTHER): Payer: BC Managed Care – PPO | Admitting: Family Medicine

## 2022-04-13 DIAGNOSIS — J029 Acute pharyngitis, unspecified: Secondary | ICD-10-CM

## 2022-04-13 MED ORDER — AMOXICILLIN 500 MG PO TABS: 500.0000 mg | ORAL_TABLET | Freq: Two times a day (BID) | ORAL | 0 refills | Status: AC

## 2022-04-13 NOTE — Progress Notes (Signed)
Name: Jesse Kane   MRN: [id number]    DOB: [birth year]   Date:04/13/2022       Progress Note  Subjective:    Chief Complaint  Chief Complaint  Patient presents with   Sore Throat   Cough    Slight cough, onset for 2 days    I connected with  [name]  on 04/13/22 at 11:00 AM EST by a video enabled telemedicine application and verified that I am speaking with the correct person using two identifiers.  I discussed the limitations of evaluation and management by telemedicine and the availability of in person appointments. The patient expressed understanding and agreed to proceed. Staff also discussed with the patient that there may be a patient responsible charge related to this service. Patient Location: pt private vehicle Provider Location: [facility] clinic Additional Individuals present: none  Sore Throat  Associated symptoms include coughing.  Cough   Pt ill for a few days, sore throat body aches slight cough, his wife and child have also been sick.  Wife today tested positive for strep and they have similar sxs getting worse. Describes throat as "raw"  Cough dry no CP SOB wheeze     There are no problems to display for this patient.   Social History   Tobacco Use   Smoking status: Former    Packs/day: 0.50    Types: Cigarettes    Quit date: 12/2018    Years since quitting: 3.3   Smokeless tobacco: Never  Substance Use Topics   Alcohol use: Not Currently    No current outpatient medications on file.  No Known Allergies  I personally reviewed active problem list, medication list, allergies, family history, social history, health maintenance, notes from last encounter, lab results, imaging with the patient/caregiver today.   Review of Systems  Constitutional: Negative.   HENT: Negative.    Eyes: Negative.   Respiratory:  Positive for cough.   Cardiovascular: Negative.   Gastrointestinal: Negative.   Endocrine: Negative.   Genitourinary: Negative.    Musculoskeletal: Negative.   Skin: Negative.   Allergic/Immunologic: Negative.   Neurological: Negative.   Hematological: Negative.   Psychiatric/Behavioral: Negative.    All other systems reviewed and are negative.     Objective:   Virtual encounter, vitals limited, only able to obtain the following There were no vitals filed for this visit. There is no height or weight on file to calculate BMI. Nursing Note and Vital Signs reviewed.  Physical Exam Vitals reviewed.  Constitutional:      General: He is not in acute distress.    Appearance: He is normal weight. He is not ill-appearing, toxic-appearing or diaphoretic.  Neck:     Trachea: Trachea normal.     Comments: Scratchy phonation Pulmonary:     Effort: No tachypnea, accessory muscle usage or respiratory distress.     Comments: No coughing during visit Able to speak in full and complete sentences Neurological:     Mental Status: He is alert.     PE limited by virtual encounter  No results found for this or any previous visit (from the past 72 hour(s)).  Assessment and Plan:     ICD-10-CM   1. Pharyngitis, unspecified etiology  J02.9 amoxicillin (AMOXIL) 500 MG tablet   strep exposure at home with wife, similar sx developing and acutely worsening today - sore throat and generalized fatigue/malaise - tx with amox       -Red flags and when to present for emergency  care or RTC including fever >101.25F, chest pain, shortness of breath, new/worsening/un-resolving symptoms, reviewed with patient at time of visit. Follow up and care instructions discussed and provided in AVS. - I discussed the assessment and treatment plan with the patient. The patient was provided an opportunity to ask questions and all were answered. The patient agreed with the plan and demonstrated an understanding of the instructions.  I provided 15 minutes of non-face-to-face time during this encounter.  Delsa Grana, PA-C 04/13/22 11:14 AM

## 2022-04-15 ENCOUNTER — Other Ambulatory Visit: Payer: Self-pay

## 2022-04-15 ENCOUNTER — Ambulatory Visit: Payer: BC Managed Care – PPO | Admitting: Nurse Practitioner

## 2022-04-15 ENCOUNTER — Encounter: Payer: Self-pay | Admitting: Nurse Practitioner

## 2022-04-15 VITALS — BP 118/80 | HR 102 | Temp 98.1°F | Ht 74.5 in | Wt 251.9 lb

## 2022-04-15 DIAGNOSIS — R051 Acute cough: Secondary | ICD-10-CM

## 2022-04-15 LAB — POCT INFLUENZA A/B
Influenza A, POC: NEGATIVE
Influenza B, POC: NEGATIVE

## 2022-04-15 MED ORDER — BENZONATATE 100 MG PO CAPS: 200.0000 mg | ORAL_CAPSULE | Freq: Two times a day (BID) | ORAL | 0 refills | Status: AC | PRN

## 2022-04-15 NOTE — Progress Notes (Signed)
   BP 118/80   Pulse (!) 102   Temp 98.1 F (36.7 C)   Ht 6' 2.5" (1.892 m)   Wt 251 lb 14.4 oz (114.3 kg)   SpO2 95%   BMI 31.91 kg/m    Subjective:    Patient ID: Jesse Kane, male    DOB: [birth year], 37 y.o.   MRN: [id number]  HPI: Jaliel Winkel is a 37 y.o. male  Chief Complaint  Patient presents with   Cough    Congested,fever, headache, muscle pain, chills for 3 days   URI: patient had virtual appointment with Delsa Grana PA on 04/13/2022 and reported he had been exposed to strep and he had a sore throat.  She started him on amoxicillin. Patient reports symptoms started three days ago.  He reports nasal congestion, fever,cough,  headache, body aches and chills.  He denies any shortness of breath. Patient reports he has been taking amoxicillin, NyQuil at night.  Flu test was negative, covid pending, throat culture pending.  Recommend taking zyrtec, flonase, mucinex, vitamin d, vitamin c, and zinc. Push fluids and get rest.    Start tessalon perls.  Relevant past medical, surgical, family and social history reviewed and updated as indicated. Interim medical history since our last visit reviewed. Allergies and medications reviewed and updated.  Review of Systems  Constitutional: positive for fever or negative  weight change.  HEENT: positive for nasal congestion, sore throat Respiratory: positive for cough and negative for shortness of breath.   Cardiovascular: Negative for chest pain or palpitations.  Gastrointestinal: Negative for abdominal pain, no bowel changes.  Musculoskeletal: Negative for gait problem or joint swelling.  Skin: Negative for rash.  Neurological: Negative for dizziness or headache.  No other specific complaints in a complete review of systems (except as listed in HPI above).      Objective:    BP 118/80   Pulse (!) 102   Temp 98.1 F (36.7 C)   Ht 6' 2.5" (1.892 m)   Wt 251 lb 14.4 oz (114.3 kg)   SpO2 95%   BMI 31.91 kg/m   Wt Readings from  Last 3 Encounters:  04/15/22 251 lb 14.4 oz (114.3 kg)  12/24/21 258 lb 1.6 oz (117.1 kg)  07/24/21 249 lb 4.8 oz (113.1 kg)    Physical Exam  Constitutional: Patient appears well-developed and well-nourished. Obese  No distress.  HEENT: head atraumatic, normocephalic, pupils equal and reactive to light, ears TMs clear, neck supple, throat within normal limits, no lymphadenopathy Cardiovascular: Normal rate, regular rhythm and normal heart sounds.  No murmur heard. No BLE edema. Pulmonary/Chest: Effort normal and breath sounds normal. No respiratory distress. Abdominal: Soft.  There is no tenderness. Psychiatric: Patient has a normal mood and affect. behavior is normal. Judgment and thought content normal.     Assessment & Plan:   Problem List Items Addressed This Visit   None Visit Diagnoses     Acute cough    -  Primary   Recommend taking zyrtec, flonase, mucinex, vitamin d, vitamin c, and zinc. Push fluids and get rest.    Start tessalon perls.pending covid and throat culture   Relevant Medications   benzonatate (TESSALON) 100 MG capsule   Other Relevant Orders   Novel Coronavirus, NAA ([facility])   POCT Influenza A/B (Completed)   Culture, Group A Strep        Follow up plan: Return if symptoms worsen or fail to improve.

## 2022-04-16 LAB — NOVEL CORONAVIRUS, NAA: SARS-CoV-2, NAA: NOT DETECTED

## 2022-04-17 LAB — CULTURE, GROUP A STREP
MICRO NUMBER:: 14600608
SPECIMEN QUALITY:: ADEQUATE

## 2022-07-27 ENCOUNTER — Encounter: Payer: BC Managed Care – PPO | Admitting: Nurse Practitioner

## 2022-07-27 NOTE — Progress Notes (Deleted)
Name: Jesse Kane   MRN: [id number]    DOB: Jul 01, 1985   Date:07/27/2022       Progress Note  Subjective  Chief Complaint  No chief complaint on file.   HPI  Patient presents for annual CPE ***.  Diet: *** Exercise: *** Sleep: *** Last dental exam:*** Last eye exam: ***  Depression: phq 9 is {gen pos neg:[id number]}    04/15/2022   11:52 AM 04/13/2022   11:03 AM 12/24/2021    3:09 PM 07/24/2021    2:53 PM  Depression screen PHQ 2/9  Decreased Interest 0 0 0 0  Down, Depressed, Hopeless 0 0 0 0  PHQ - 2 Score 0 0 0 0  Altered sleeping  0 0   Tired, decreased energy  0 0   Change in appetite  0 0   Feeling bad or failure about yourself   0 0   Trouble concentrating  0 0   Moving slowly or fidgety/restless  0 0   Suicidal thoughts  0 0   PHQ-9 Score  0 0   Difficult doing work/chores  Not difficult at all Not difficult at all     Hypertension:  BP Readings from Last 3 Encounters:  04/15/22 118/80  12/24/21 132/78  07/24/21 120/72    Obesity: Wt Readings from Last 3 Encounters:  04/15/22 251 lb 14.4 oz (114.3 kg)  12/24/21 258 lb 1.6 oz (117.1 kg)  07/24/21 249 lb 4.8 oz (113.1 kg)   BMI Readings from Last 3 Encounters:  04/15/22 31.91 kg/m  12/24/21 32.69 kg/m  07/24/21 31.58 kg/m     Lipids:  Lab Results  Component Value Date   CHOL 199 07/24/2021   Lab Results  Component Value Date   HDL 30 (L) 07/24/2021   Lab Results  Component Value Date   LDLCALC 126 (H) 07/24/2021   Lab Results  Component Value Date   TRIG 298 (H) 07/24/2021   Lab Results  Component Value Date   CHOLHDL 6.6 (H) 07/24/2021   No results found for: "LDLDIRECT" Glucose:  Glucose, Bld  Date Value Ref Range Status  07/24/2021 87 65 - 99 mg/dL Final    Comment:    .            Fasting reference interval .   06/06/2017 101 (H) 65 - 99 mg/dL Final  40/98/1191 79 65 - 99 mg/dL Final    Flowsheet Row [facility] Visit from [facility]/15/2022 in [facility]   AUDIT-C Score 0        Married STD testing and prevention (HIV/chl/gon/syphilis): 07/24/2021 Hep C: 07/24/2021  Skin cancer: Discussed monitoring for atypical lesions Colorectal cancer: no concerns, does not qualify Prostate cancer: no concerns, does not qualify No results found for: "PSA"   Lung cancer:   Low Dose CT Chest recommended if Age 29-80 years, 30 pack-year currently smoking OR have quit w/in 15years. Patient does not qualify.   AAA:  The USPSTF recommends one-time screening with ultrasonography in men ages 7 to 67 years who have ever smoked ECG:  06/02/2016  Vaccines:  HPV: up to at age 48 , ask insurance if age between 1-45  Shingrix: 32-64 yo and ask insurance if covered when patient above 69 yo Pneumonia:  educated and discussed with patient. Flu:  educated and discussed with patient.  Advanced Care Planning: A voluntary discussion about advance care planning including the explanation and discussion of advance directives.  Discussed health care proxy and Living will,  and the patient was able to identify a health care proxy as ***.  Patient {DOES_DOES FAO:13086} have a living will at present time. If patient does have living will, I have requested they bring this to the clinic to be scanned in to their chart.  There are no problems to display for this patient.   No past surgical history on file.  Family History  Problem Relation Age of Onset   Healthy Mother    Thyroid nodules Mother    Healthy Father     Social History   Socioeconomic History   Marital status: Married    Spouse name: Lurena Joiner   Number of children: 2   Years of education: Not on file   Highest education level: Not on file  Occupational History   Not on file  Tobacco Use   Smoking status: Former    Packs/day: .5    Types: Cigarettes    Quit date: 12/2018    Years since quitting: 3.5   Smokeless tobacco: Never  Vaping Use   Vaping Use: Never used  Substance and Sexual Activity    Alcohol use: Not Currently   Drug use: No   Sexual activity: Yes  Other Topics Concern   Not on file  Social History Narrative   Not on file   Social Determinants of Health   Financial Resource Strain: Low Risk  (07/24/2021)   Overall Financial Resource Strain (CARDIA)    Difficulty of Paying Living Expenses: Not hard at all  Food Insecurity: No Food Insecurity (07/24/2021)   Hunger Vital Sign    Worried About Running Out of Food in the Last Year: Never true    Ran Out of Food in the Last Year: Never true  Transportation Needs: No Transportation Needs (07/24/2021)   PRAPARE - Administrator, Civil Service (Medical): No    Lack of Transportation (Non-Medical): No  Physical Activity: Sufficiently Active (07/24/2021)   Exercise Vital Sign    Days of Exercise per Week: 5 days    Minutes of Exercise per Session: 60 min  Stress: No Stress Concern Present (07/24/2021)   [employer] of Occupational Health - Occupational Stress Questionnaire    Feeling of Stress : Only a little  Social Connections: Socially Integrated (07/24/2021)   Social Connection and Isolation Panel [NHANES]    Frequency of Communication with Friends and Family: More than three times a week    Frequency of Social Gatherings with Friends and Family: More than three times a week    Attends Religious Services: More than 4 times per year    Active Member of [employer] or Organizations: Yes    Attends Engineer, structural: More than 4 times per year    Marital Status: Married  Catering manager Violence: Not At Risk (07/24/2021)   Humiliation, Afraid, Rape, and Kick questionnaire    Fear of Current or Ex-Partner: No    Emotionally Abused: No    Physically Abused: No    Sexually Abused: No     Current Outpatient Medications:    benzonatate (TESSALON) 100 MG capsule, Take 2 capsules (200 mg total) by mouth 2 (two) times daily as needed for cough., Disp: 20 capsule, Rfl: 0  No Known  Allergies   ROS  Constitutional: Negative for fever or weight change.  Respiratory: Negative for cough and shortness of breath.   Cardiovascular: Negative for chest pain or palpitations.  Gastrointestinal: Negative for abdominal pain, no bowel changes.  Musculoskeletal: Negative for  gait problem or joint swelling.  Skin: Negative for rash.  Neurological: Negative for dizziness or headache.  No other specific complaints in a complete review of systems (except as listed in HPI above).    Objective  There were no vitals filed for this visit.  There is no height or weight on file to calculate BMI.  Physical Exam Constitutional: Patient appears well-developed and well-nourished. No distress.  HENT: Head: Normocephalic and atraumatic. Ears: B TMs ok, no erythema or effusion; Nose: Nose normal. Mouth/Throat: Oropharynx is clear and moist. No oropharyngeal exudate.  Eyes: Conjunctivae and EOM are normal. Pupils are equal, round, and reactive to light. No scleral icterus.  Neck: Normal range of motion. Neck supple. No JVD present. No thyromegaly present.  Cardiovascular: Normal rate, regular rhythm and normal heart sounds.  No murmur heard. No BLE edema. Pulmonary/Chest: Effort normal and breath sounds normal. No respiratory distress. Abdominal: Soft. Bowel sounds are normal, no distension. There is no tenderness. no masses MALE GENITALIA: Normal descended testes bilaterally, no masses palpated, no hernias, no lesions, no discharge RECTAL: Prostate normal size and consistency, no rectal masses or hemorrhoids Musculoskeletal: Normal range of motion, no joint effusions. No gross deformities Neurological: he is alert and oriented to person, place, and time. No cranial nerve deficit. Coordination, balance, strength, speech and gait are normal.  Skin: Skin is warm and dry. No rash noted. No erythema.  Psychiatric: Patient has a normal mood and affect. behavior is normal. Judgment and thought  content normal.   No results found for this or any previous visit (from the past 2160 hour(s)).   Fall Risk:    04/15/2022   11:52 AM 04/13/2022   11:03 AM 12/24/2021    3:09 PM 07/24/2021    2:52 PM  Fall Risk   Falls in the past year? 0 0 0 0  Number falls in past yr: 0 0 0 0  Injury with Fall? 0 0 0 0  Risk for fall due to :  No Fall Risks No Fall Risks   Follow up  Falls prevention discussed;Education provided;Falls evaluation completed Falls prevention discussed;Education provided;Falls evaluation completed Falls evaluation completed   ***   Functional Status Survey:   ***   Assessment & Plan  There are no diagnoses linked to this encounter.   -Prostate cancer screening and PSA options (with potential risks and benefits of testing vs not testing) were discussed along with recent recs/guidelines. -USPSTF grade A and B recommendations reviewed with patient; age-appropriate recommendations, preventive care, screening tests, etc discussed and encouraged; healthy living encouraged; see AVS for patient education given to patient -Discussed importance of 150 minutes of physical activity weekly, eat two servings of fish weekly, eat one serving of tree nuts ( cashews, pistachios, pecans, almonds.Marland Kitchen) every other day, eat 6 servings of fruit/vegetables daily and drink plenty of water and avoid sweet beverages.

## 2022-12-30 ENCOUNTER — Ambulatory Visit: Payer: BC Managed Care – PPO | Admitting: Physician Assistant

## 2022-12-30 ENCOUNTER — Encounter: Payer: Self-pay | Admitting: Physician Assistant

## 2022-12-30 VITALS — BP 128/84 | HR 85 | Temp 97.9°F | Resp 16 | Ht 74.5 in | Wt 247.6 lb

## 2022-12-30 DIAGNOSIS — J069 Acute upper respiratory infection, unspecified: Secondary | ICD-10-CM | POA: Diagnosis not present

## 2022-12-30 DIAGNOSIS — J029 Acute pharyngitis, unspecified: Secondary | ICD-10-CM | POA: Diagnosis not present

## 2022-12-30 LAB — POCT RAPID STREP A (OFFICE): Rapid Strep A Screen: NEGATIVE

## 2022-12-30 NOTE — Patient Instructions (Signed)
Based on your described symptoms and the duration of symptoms it is likely that you have a viral upper respiratory infection (often called a "cold")  Symptoms can last for 3-10 days with lingering cough and intermittent symptoms lasting weeks after that.  The goal of treatment at this time is to reduce your symptoms and discomfort   You can use Dayquil and Nyquil to help with symptoms  You can also use Tylenol for body aches and fever reduction I also recommend adding an antihistamine to your daily regimen This includes medications like Claritin, Allegra, Zyrtec- the generics of these work very well and are usually less expensive I recommend using Flonase nasal spray - 2 puffs twice per day to help with your nasal congestion The antihistamines and Flonase can take a few weeks to provide significant relief from allergy symptoms but should start to provide some benefit soon. You can use a humidifier at night to help with preventing nasal dryness and irritation   If your symptoms do not improve or become worse in the next 5-7 days please make an apt at the office so we can see you  Go to the ER if you begin to have more serious symptoms such as shortness of breath, trouble breathing, loss of consciousness, swelling around the eyes, high fever, severe lasting headaches, vision changes or neck pain/stiffness.

## 2022-12-30 NOTE — Progress Notes (Signed)
Acute Office Visit   Patient: Jesse Kane   DOB: 1985/09/20   37 y.o. Male  MRN: [id number] Visit Date: 12/30/2022  Today's healthcare provider: Oswaldo Conroy Tashawn Laswell, PA-C  Introduced myself to the patient as a Secondary school teacher and provided education on APPs in clinical practice.    Chief Complaint  Patient presents with   Sore Throat   Nasal Congestion   Fatigue    Sx since Monday   Subjective    HPI HPI     Fatigue    Additional comments: Sx since Monday      Last edited by Forde Radon, CMA on 12/30/2022  9:37 AM.       URI -type symptoms   Onset: sudden  Duration: started Sunday evening/ Monday  Associated symptoms: sore throat, subjective adenopathy, some phlegm production, hoarse voice  Denies known fever   Interventions: Started Dayquil yesterday but did not provide much relief   Recent sick contacts: reports his 37 yo had a dry cough over the weekend but no other symptoms  Recent travel: none  COVID testing at home: has not tested   Result: NA      Medications: Outpatient Medications Prior to Visit  Medication Sig   benzonatate (TESSALON) 100 MG capsule Take 2 capsules (200 mg total) by mouth 2 (two) times daily as needed for cough. (Patient not taking: Reported on 12/30/2022)   No facility-administered medications prior to visit.    Review of Systems  Constitutional:  Positive for chills and fatigue. Negative for fever.  HENT:  Positive for congestion, postnasal drip, sore throat and voice change. Negative for ear pain.   Respiratory:  Positive for cough (mild to clear throat). Negative for chest tightness, shortness of breath and wheezing.   Gastrointestinal:  Negative for diarrhea, nausea and vomiting.  Musculoskeletal:  Negative for myalgias.  Neurological:  Negative for dizziness, light-headedness and headaches.  Hematological:  Positive for adenopathy.        Objective    BP 128/84   Pulse 85   Temp 97.9 F (36.6 C) (Oral)    Resp 16   Ht 6' 2.5" (1.892 m)   Wt 247 lb 9.6 oz (112.3 kg)   SpO2 97%   BMI 31.36 kg/m     Physical Exam Vitals reviewed.  Constitutional:      General: He is awake.     Appearance: Normal appearance. He is well-developed and well-groomed.  HENT:     Head: Normocephalic and atraumatic.     Right Ear: Hearing, tympanic membrane and ear canal normal.     Left Ear: Hearing, tympanic membrane and ear canal normal.     Mouth/Throat:     Lips: Pink.     Mouth: Mucous membranes are moist.     Pharynx: Uvula midline. Posterior oropharyngeal erythema present. No uvula swelling or postnasal drip.  Cardiovascular:     Rate and Rhythm: Normal rate and regular rhythm.     Heart sounds: Normal heart sounds. No murmur heard.    No friction rub. No gallop.  Pulmonary:     Effort: Pulmonary effort is normal.     Breath sounds: Normal breath sounds. No decreased air movement. No decreased breath sounds, wheezing, rhonchi or rales.  Lymphadenopathy:     Head:     Right side of head: No submental, submandibular or preauricular adenopathy.     Left side of head: No submental, submandibular or preauricular adenopathy.  Cervical:     Right cervical: No superficial cervical adenopathy.    Left cervical: No superficial cervical adenopathy.     Upper Body:     Right upper body: No supraclavicular adenopathy.     Left upper body: No supraclavicular adenopathy.  Neurological:     Mental Status: He is alert.  Psychiatric:        Behavior: Behavior is cooperative.       Results for orders placed or performed in visit on 12/30/22  POCT rapid strep A  Result Value Ref Range   Rapid Strep A Screen Negative Negative    Assessment & Plan      No follow-ups on file.      Problem List Items Addressed This Visit   None Visit Diagnoses     Viral upper respiratory tract infection    -  Primary   Sore throat       Relevant Orders   POCT rapid strep A (Completed)      Visit with  patient indicates symptoms comprised of sore throat, congestion, postnasal drainage, fatigue, chills since Sunday congruent with acute URI that is likely viral in nature  Patient is outside therapeutic window for antivirals- no flu or COVID testing performed today.  Rapid strep negative- results reviewed with patient during apt Due to nature and duration of symptoms recommended treatment regimen is symptomatic relief and follow up if needed Discussed with patient the various viral and bacterial etiologies of current illness and appropriate course of treatment Discussed OTC medication options for multisymptom relief such as Dayquil/Nyquil, Theraflu, AlkaSeltzer, etc. Discussed return precautions if symptoms are not improving or worsen over next 5-7 days.    No follow-ups on file.   I, Ashad Fawbush E Obi Scrima, PA-C, have reviewed all documentation for this visit. The documentation on 12/30/22 for the exam, diagnosis, procedures, and orders are all accurate and complete.   Jacquelin Hawking, MHS, PA-C [facility]

## 2023-01-07 ENCOUNTER — Ambulatory Visit: Payer: Self-pay | Admitting: *Deleted

## 2023-01-07 NOTE — Telephone Encounter (Signed)
Reason for Disposition  [1] Chest pain(s) lasting a few seconds from coughing AND [2] persists > 3 days    Chest tightness over left lung and still coughing.  Answer Assessment - Initial Assessment Questions 1. LOCATION: "Where does it hurt?"       This is the 3rd day I've been having tightness in my left chest.    I was in a week and a half ago seen for URI.   Then I got a  sore throat and I ended up with a cough after that visit.    I came in last week for the sore throat and coughing.     I didn't have other symptoms at that time.    Strep negative.   The next day after coming in I had a fever 101 and very tired.   Now I'm still coughing up mucus and sneezing yellow mucus.   What I'm coughing up is white.   I'm still fighting the mucus in my chest.   On my left side of my chest is tight.   It's not a pain.   It's a tightness  and it's hard to get comfortable.   I'm still fighting this congestion in my chest.    I'm worried about an infection in my lung.     No fever since last Sat.   My wife told me to call.   I'm out of town on the road.   No dizziness, or sweating.   I've been driving for a while so I'm not moving around.    2. RADIATION: "Does the pain go anywhere else?" (e.g., into neck, jaw, arms, back)     No 3. ONSET: "When did the chest pain begin?" (Minutes, hours or days)      3 days ago and I've still got this cough that is not going away. 4. PATTERN: "Does the pain come and go, or has it been constant since it started?"  "Does it get worse with exertion?"      I can't get comfortable due to the tightness on the left side of my chest. 5. DURATION: "How long does it last" (e.g., seconds, minutes, hours)     Not asked 6. SEVERITY: "How bad is the pain?"  (e.g., Scale 1-10; mild, moderate, or severe)    - MILD (1-3): doesn't interfere with normal activities     - MODERATE (4-7): interferes with normal activities or awakens from sleep    - SEVERE (8-10): excruciating pain, unable to do  any normal activities       Moderate 7. CARDIAC RISK FACTORS: "Do you have any history of heart problems or risk factors for heart disease?" (e.g., angina, prior heart attack; diabetes, high blood pressure, high cholesterol, smoker, or strong family history of heart disease)     Not asked since been evaluated 8. PULMONARY RISK FACTORS: "Do you have any history of lung disease?"  (e.g., blood clots in lung, asthma, emphysema, birth control pills)     Not asked since been evaluated at 2 prior OV. 9. CAUSE: "What do you think is causing the chest pain?"     I'm wondering if I've got infection in my lung from being sick. 10. OTHER SYMPTOMS: "Do you have any other symptoms?" (e.g., dizziness, nausea, vomiting, sweating, fever, difficulty breathing, cough)       Denies dizziness, breaking out in a sweat. 11. PREGNANCY: "Is there any chance you are pregnant?" "When was your last menstrual period?"  N/A  Protocols used: Chest Pain-A-AH

## 2023-01-07 NOTE — Telephone Encounter (Signed)
  Chief Complaint: Coughing, still having congestion in chest, having tightness over left lung.   Wondering if he has an infection going on in his lung. Symptoms: coughing up white mucus and having chest tightness over left lung for last 3 days. Frequency: The last 3 days.   Has been seen in office twice recently for URI symptoms.  Cough is lingering. Pertinent Negatives: Patient denies dizziness, sweating. Disposition: [] ED /[x] Urgent Care (no appt availability in office) / [] Appointment(In office/virtual)/ []  [facility]/ [] Home Care/ [] Refused Recommended Disposition /[] [facility]/ []  Follow-up with PCP Additional Notes: He will not be back in town soon enough to be seen in the office today before it closes.   I have encouraged him to go to the urgent care when he gets back into town.   If it's after urgent has closed then go to the ED because he needs to be seen today.    Pt. Was agreeable to this plan.   "I'll go to the urgent care".    I let him know if he needed to he can go to the nearest ED wherever he is out of town.    I offered him an appt for tomorrow morning but he said he didn't know if he could make it or not.    It would depend on his boss so didn't want to make the appt. At this time.   Agreeable to just going to the urgent care when back in town today.  Was seen on 12/30/2022 by Jacquelin Hawking, PA-C for viral URI.

## 2023-01-07 NOTE — Telephone Encounter (Signed)
Julie notified.
